# Patient Record
Sex: Male | Born: 2000 | Race: Black or African American | Hispanic: No | Marital: Single | Smoking: Never smoker
Health system: Southern US, Community
[De-identification: ages and names within clinical notes are randomized; demographics above are authoritative.]

## PROBLEM LIST (undated history)

## (undated) DIAGNOSIS — J45909 Unspecified asthma, uncomplicated: Secondary | ICD-10-CM

## (undated) DIAGNOSIS — F988 Other specified behavioral and emotional disorders with onset usually occurring in childhood and adolescence: Secondary | ICD-10-CM

---
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
## 2021-05-03 ENCOUNTER — Encounter (HOSPITAL_COMMUNITY)
Admission: EM | Disposition: A | Discharge status: Home / Self Care | Payer: Self-pay | Source: Home / Self Care | Attending: Emergency Medicine

## 2021-05-03 ENCOUNTER — Emergency Department (HOSPITAL_COMMUNITY): Payer: BC Managed Care – PPO

## 2021-05-03 ENCOUNTER — Encounter (HOSPITAL_COMMUNITY): Payer: Self-pay

## 2021-05-03 ENCOUNTER — Emergency Department (HOSPITAL_COMMUNITY): Payer: BC Managed Care – PPO | Admitting: Registered Nurse

## 2021-05-03 ENCOUNTER — Ambulatory Visit (HOSPITAL_COMMUNITY)
Admission: EM | Admit: 2021-05-03 | Discharge: 2021-05-04 | Disposition: A | Discharge status: Home / Self Care | Payer: BC Managed Care – PPO | Attending: Emergency Medicine | Admitting: Emergency Medicine

## 2021-05-03 ENCOUNTER — Other Ambulatory Visit: Payer: Self-pay

## 2021-05-03 DIAGNOSIS — Z79899 Other long term (current) drug therapy: Secondary | ICD-10-CM | POA: Insufficient documentation

## 2021-05-03 DIAGNOSIS — N4400 Torsion of testis, unspecified: Secondary | ICD-10-CM | POA: Insufficient documentation

## 2021-05-03 DIAGNOSIS — N433 Hydrocele, unspecified: Secondary | ICD-10-CM | POA: Diagnosis not present

## 2021-05-03 HISTORY — PX: TESTICLE TORSION REDUCTION: SHX795

## 2021-05-03 LAB — CBC WITH DIFFERENTIAL/PLATELET
Abs Immature Granulocytes: 0.03 10*3/uL (ref 0.00–0.07)
Basophils Absolute: 0 10*3/uL (ref 0.0–0.1)
Basophils Relative: 0 %
Eosinophils Absolute: 0 10*3/uL (ref 0.0–0.5)
Eosinophils Relative: 0 %
HCT: 45.7 % (ref 39.0–52.0)
Hemoglobin: 15.7 g/dL (ref 13.0–17.0)
Immature Granulocytes: 0 %
Lymphocytes Relative: 7 %
Lymphs Abs: 0.5 10*3/uL — ABNORMAL LOW (ref 0.7–4.0)
MCH: 31.2 pg (ref 26.0–34.0)
MCHC: 34.4 g/dL (ref 30.0–36.0)
MCV: 90.9 fL (ref 80.0–100.0)
Monocytes Absolute: 0.2 10*3/uL (ref 0.1–1.0)
Monocytes Relative: 2 %
Neutro Abs: 7.2 10*3/uL (ref 1.7–7.7)
Neutrophils Relative %: 91 %
Platelets: 251 10*3/uL (ref 150–400)
RBC: 5.03 MIL/uL (ref 4.22–5.81)
RDW: 12.8 % (ref 11.5–15.5)
WBC: 7.9 10*3/uL (ref 4.0–10.5)
nRBC: 0 % (ref 0.0–0.2)

## 2021-05-03 LAB — COMPREHENSIVE METABOLIC PANEL
ALT: 19 U/L (ref 0–44)
AST: 27 U/L (ref 15–41)
Albumin: 4.5 g/dL (ref 3.5–5.0)
Alkaline Phosphatase: 51 U/L (ref 38–126)
Anion gap: 10 (ref 5–15)
BUN: 16 mg/dL (ref 6–20)
CO2: 25 mmol/L (ref 22–32)
Calcium: 9.4 mg/dL (ref 8.9–10.3)
Chloride: 102 mmol/L (ref 98–111)
Creatinine, Ser: 1.03 mg/dL (ref 0.61–1.24)
GFR, Estimated: >60 mL/min (ref >60)
Glucose, Bld: 143 mg/dL — ABNORMAL HIGH (ref 70–99)
Potassium: 4.7 mmol/L (ref 3.5–5.1)
Sodium: 137 mmol/L (ref 135–145)
Total Bilirubin: 0.5 mg/dL (ref 0.3–1.2)
Total Protein: 8 g/dL (ref 6.5–8.1)

## 2021-05-03 LAB — LIPASE, BLOOD: Lipase: 23 U/L (ref 11–51)

## 2021-05-03 SURGERY — TESTICULAR TORSION REPAIR
Anesthesia: General | Site: Scrotum

## 2021-05-03 MED ORDER — BUPIVACAINE HCL 0.25 % IJ SOLN
INTRAMUSCULAR | Status: AC
  Filled 2021-05-03: qty 1

## 2021-05-03 MED ORDER — LACTATED RINGERS IV SOLN
INTRAVENOUS | Status: DC | PRN
  Administered 2021-05-04: 1000 mL via INTRAVENOUS

## 2021-05-03 MED ORDER — ONDANSETRON HCL 4 MG/2ML IJ SOLN
4.0000 mg | Freq: Once | INTRAMUSCULAR | Status: AC
  Administered 2021-05-03: 4 mg via INTRAVENOUS
  Filled 2021-05-03: qty 2

## 2021-05-03 MED ORDER — CEFAZOLIN SODIUM-DEXTROSE 2-4 GM/100ML-% IV SOLN
INTRAVENOUS | Status: AC
  Filled 2021-05-03: qty 100

## 2021-05-03 MED ORDER — SUCCINYLCHOLINE CHLORIDE 200 MG/10ML IV SOSY
PREFILLED_SYRINGE | INTRAVENOUS | Status: DC | PRN
  Administered 2021-05-03: 130 mg via INTRAVENOUS

## 2021-05-03 MED ORDER — DEXAMETHASONE SODIUM PHOSPHATE 10 MG/ML IJ SOLN
INTRAMUSCULAR | Status: AC
  Filled 2021-05-03: qty 1

## 2021-05-03 MED ORDER — FENTANYL CITRATE (PF) 100 MCG/2ML IJ SOLN
INTRAMUSCULAR | Status: DC | PRN
  Administered 2021-05-03: 50 ug via INTRAVENOUS
  Administered 2021-05-03: 100 ug via INTRAVENOUS

## 2021-05-03 MED ORDER — 0.9 % SODIUM CHLORIDE (POUR BTL) OPTIME
TOPICAL | Status: DC | PRN
  Administered 2021-05-03: 1000 mL

## 2021-05-03 MED ORDER — MORPHINE SULFATE (PF) 4 MG/ML IV SOLN
4.0000 mg | Freq: Once | INTRAVENOUS | Status: AC
  Administered 2021-05-03: 4 mg via INTRAVENOUS
  Filled 2021-05-03: qty 1

## 2021-05-03 MED ORDER — MEPERIDINE HCL 50 MG/ML IJ SOLN: 6.2500 mg | INTRAMUSCULAR | Status: DC | PRN

## 2021-05-03 MED ORDER — OXYCODONE HCL 5 MG/5ML PO SOLN: 5.0000 mg | Freq: Once | ORAL | Status: DC | PRN

## 2021-05-03 MED ORDER — FENTANYL CITRATE (PF) 100 MCG/2ML IJ SOLN
25.0000 ug | Freq: Once | INTRAMUSCULAR | Status: AC
  Administered 2021-05-03: 25 ug via INTRAVENOUS
  Filled 2021-05-03: qty 2

## 2021-05-03 MED ORDER — MIDAZOLAM HCL 2 MG/2ML IJ SOLN
INTRAMUSCULAR | Status: AC
  Filled 2021-05-03: qty 2

## 2021-05-03 MED ORDER — SODIUM CHLORIDE 0.9 % IV SOLN
2.0000 g | Freq: Once | INTRAVENOUS | Status: AC
  Administered 2021-05-03: 2 g via INTRAVENOUS

## 2021-05-03 MED ORDER — MIDAZOLAM HCL 2 MG/2ML IJ SOLN: 0.5000 mg | Freq: Once | INTRAMUSCULAR | Status: DC | PRN

## 2021-05-03 MED ORDER — DEXAMETHASONE SODIUM PHOSPHATE 10 MG/ML IJ SOLN
INTRAMUSCULAR | Status: DC | PRN
  Administered 2021-05-03: 10 mg via INTRAVENOUS

## 2021-05-03 MED ORDER — ONDANSETRON HCL 4 MG/2ML IJ SOLN
INTRAMUSCULAR | Status: AC
  Filled 2021-05-03: qty 2

## 2021-05-03 MED ORDER — HYDROMORPHONE HCL 1 MG/ML IJ SOLN: 0.2500 mg | INTRAMUSCULAR | Status: DC | PRN

## 2021-05-03 MED ORDER — MIDAZOLAM HCL 5 MG/5ML IJ SOLN
INTRAMUSCULAR | Status: DC | PRN
  Administered 2021-05-03: 2 mg via INTRAVENOUS

## 2021-05-03 MED ORDER — HYDROMORPHONE HCL 1 MG/ML IJ SOLN
INTRAMUSCULAR | Status: DC | PRN
  Administered 2021-05-03: .5 mg via INTRAVENOUS

## 2021-05-03 MED ORDER — HYDROMORPHONE HCL 2 MG/ML IJ SOLN
INTRAMUSCULAR | Status: AC
  Filled 2021-05-03: qty 1

## 2021-05-03 MED ORDER — OXYCODONE HCL 5 MG PO TABS: 5.0000 mg | ORAL_TABLET | Freq: Once | ORAL | Status: DC | PRN

## 2021-05-03 MED ORDER — PROPOFOL 10 MG/ML IV BOLUS
INTRAVENOUS | Status: DC | PRN
  Administered 2021-05-03: 200 mg via INTRAVENOUS

## 2021-05-03 MED ORDER — PROPOFOL 10 MG/ML IV BOLUS
INTRAVENOUS | Status: AC
  Filled 2021-05-03: qty 20

## 2021-05-03 MED ORDER — SODIUM CHLORIDE 0.9 % IV BOLUS
1000.0000 mL | Freq: Once | INTRAVENOUS | Status: AC
  Administered 2021-05-03: 1000 mL via INTRAVENOUS

## 2021-05-03 MED ORDER — PROMETHAZINE HCL 25 MG/ML IJ SOLN: 6.2500 mg | INTRAMUSCULAR | Status: DC | PRN

## 2021-05-03 MED ORDER — ROCURONIUM BROMIDE 10 MG/ML (PF) SYRINGE
PREFILLED_SYRINGE | INTRAVENOUS | Status: DC | PRN
  Administered 2021-05-03: 45 mg via INTRAVENOUS

## 2021-05-03 MED ORDER — FENTANYL CITRATE (PF) 250 MCG/5ML IJ SOLN
INTRAMUSCULAR | Status: AC
  Filled 2021-05-03: qty 5

## 2021-05-03 MED ORDER — KETOROLAC TROMETHAMINE 30 MG/ML IJ SOLN
30.0000 mg | Freq: Once | INTRAMUSCULAR | Status: AC
  Administered 2021-05-03: 30 mg via INTRAVENOUS
  Filled 2021-05-03: qty 1

## 2021-05-03 SURGICAL SUPPLY — 31 items
BAG COUNTER SPONGE SURGICOUNT (BAG) IMPLANT
BENZOIN TINCTURE PRP APPL 2/3 (GAUZE/BANDAGES/DRESSINGS) ×2 IMPLANT
BNDG GAUZE ELAST 4 BULKY (GAUZE/BANDAGES/DRESSINGS) ×2 IMPLANT
DECANTER SPIKE VIAL GLASS SM (MISCELLANEOUS) ×2 IMPLANT
DERMABOND ADVANCED (GAUZE/BANDAGES/DRESSINGS) ×1
DERMABOND ADVANCED .7 DNX12 (GAUZE/BANDAGES/DRESSINGS) ×1 IMPLANT
DRAIN PENROSE 0.25X18 (DRAIN) IMPLANT
DRAIN PENROSE 0.5X18 (DRAIN) IMPLANT
DRAPE LAPAROTOMY T 98X78 PEDS (DRAPES) ×4 IMPLANT
ELECT PENCIL ROCKER SW 15FT (MISCELLANEOUS) ×2 IMPLANT
ELECT REM PT RETURN 15FT ADLT (MISCELLANEOUS) ×2 IMPLANT
GAUZE SPONGE 4X4 12PLY STRL (GAUZE/BANDAGES/DRESSINGS) ×2 IMPLANT
GLOVE SURG POLYISO LF SZ8 (GLOVE) ×2 IMPLANT
GOWN STRL REUS W/TWL XL LVL3 (GOWN DISPOSABLE) ×2 IMPLANT
HEMOSTAT ARISTA ABSORB 3G PWDR (HEMOSTASIS) ×2 IMPLANT
KIT BASIN OR (CUSTOM PROCEDURE TRAY) ×2 IMPLANT
KIT TURNOVER KIT A (KITS) ×2 IMPLANT
NEEDLE HYPO 22GX1.5 SAFETY (NEEDLE) ×2 IMPLANT
NS IRRIG 1000ML POUR BTL (IV SOLUTION) IMPLANT
PACK GENERAL/GYN (CUSTOM PROCEDURE TRAY) ×2 IMPLANT
SUPPORT SCROTAL LG STRP (MISCELLANEOUS) ×2 IMPLANT
SUT CHROMIC 3 0 SH 27 (SUTURE) ×2 IMPLANT
SUT CHROMIC 4 0 SH 27 (SUTURE) IMPLANT
SUT PROLENE 4 0 PS 2 18 (SUTURE) ×6 IMPLANT
SUT VIC AB 2-0 SH 27 (SUTURE) ×2
SUT VIC AB 2-0 SH 27X BRD (SUTURE) ×1 IMPLANT
SUT VIC AB 3-0 SH 27 (SUTURE) ×2
SUT VIC AB 3-0 SH 27XBRD (SUTURE) ×1 IMPLANT
SUT VICRYL 0 TIES 12 18 (SUTURE) ×2 IMPLANT
SYR CONTROL 10ML LL (SYRINGE) ×2 IMPLANT
WATER STERILE IRR 1000ML POUR (IV SOLUTION) IMPLANT

## 2021-05-03 NOTE — ED Provider Notes (Signed)
[facility]-EMERGENCY DEPT Provider Note   CSN: [id number] Arrival date & time: 05/03/21  1916     History Chief Complaint  Patient presents with   Nephrolithiasis    Philip Beltran is a 20 y.o. male.  HPI  20 year old male with a history of ADD, asthma, who presents to the emergency department today for evaluation of left-sided abdominal pain.  Started having some mild left lower abdominal pain last night, today had some more severe pain that has been constant and worsening since onset.  He also reports associated pain to the left testicle and has had some nausea and vomiting.  He denies any dysuria but does report some urinary retention.  Past Medical History:  Diagnosis Date   ADD (attention deficit disorder)    Asthma     There are no problems to display for this patient.   History reviewed. No pertinent surgical history.     History reviewed. No pertinent family history.     Home Medications Prior to Admission medications   Medication Sig Start Date End Date Taking? Authorizing Provider  acetaminophen (TYLENOL) 160 MG/5ML liquid Take 320 mg by mouth every 4 (four) hours as needed for fever or pain.   Yes [provider]  albuterol (VENTOLIN HFA) 108 (90 Base) MCG/ACT inhaler Inhale 2 puffs into the lungs every 6 (six) hours as needed for wheezing or shortness of breath. 07/25/17  Yes [provider]  ibuprofen (ADVIL) 200 MG tablet Take 200 mg by mouth every 6 (six) hours as needed for mild pain.   Yes [provider]  loratadine (CLARITIN) 10 MG tablet Take 10 mg by mouth daily. 09/02/19  Yes [provider]  ibuprofen (ADVIL,MOTRIN) 400 MG tablet Take 1 tablet (400 mg total) by mouth every 6 (six) hours as needed. Patient not taking: No sig reported 02/02/18   Cato Mulligan, NP    Allergies    Patient has no known allergies.  Review of Systems   Review of Systems  Constitutional:  Negative for  chills and fever.  HENT:  Negative for ear pain and sore throat.   Eyes:  Negative for visual disturbance.  Respiratory:  Negative for cough and shortness of breath.   Cardiovascular:  Negative for chest pain.  Gastrointestinal:  Positive for abdominal pain, nausea and vomiting. Negative for constipation and diarrhea.  Genitourinary:  Positive for difficulty urinating. Negative for dysuria and hematuria.  Musculoskeletal:  Negative for back pain.  Skin:  Negative for rash.  Neurological:  Negative for headaches.  All other systems reviewed and are negative.  Physical Exam Updated Vital Signs BP 132/84   Pulse 92   Temp 98 F (36.7 C) (Oral)   Resp 17   Ht 5\' 4"  (1.626 m)   Wt 54.4 kg   SpO2 100%   BMI 20.60 kg/m   Physical Exam Vitals and nursing note reviewed.  Constitutional:      General: He is in acute distress.     Appearance: He is well-developed.  HENT:     Head: Normocephalic and atraumatic.  Eyes:     Conjunctiva/sclera: Conjunctivae normal.  Cardiovascular:     Rate and Rhythm: Normal rate and regular rhythm.     Heart sounds: Normal heart sounds. No murmur heard. Pulmonary:     Effort: Pulmonary effort is normal. No respiratory distress.     Breath sounds: Normal breath sounds. No wheezing, rhonchi or rales.  Abdominal:     General:  Bowel sounds are normal.     Palpations: Abdomen is soft.     Tenderness: There is abdominal tenderness (left inguinal region). There is no right CVA tenderness, left CVA tenderness, guarding or rebound.  Genitourinary:    Comments: Chaperone present. Normal uncircumcised penis. TTP noted to the left testicle. No right testicular TTP present. No erythema, swelling or warmth noted. No lesions or rashes noted. Musculoskeletal:     Cervical back: Neck supple.  Skin:    General: Skin is warm and dry.  Neurological:     Mental Status: He is alert.    ED Results / Procedures / Treatments   Labs (all labs ordered are listed, but  only abnormal results are displayed) Labs Reviewed  CBC WITH DIFFERENTIAL/PLATELET - Abnormal; Notable for the following components:      Result Value   Lymphs Abs 0.5 (*)    All other components within normal limits  COMPREHENSIVE METABOLIC PANEL - Abnormal; Notable for the following components:   Glucose, Bld 143 (*)    All other components within normal limits  LIPASE, BLOOD  URINALYSIS, ROUTINE W REFLEX MICROSCOPIC    EKG None  Radiology CT Renal Stone Study  Result Date: 05/03/2021 CLINICAL DATA:  Flank pain, nausea and vomiting. EXAM: CT ABDOMEN AND PELVIS WITHOUT CONTRAST TECHNIQUE: Multidetector CT imaging of the abdomen and pelvis was performed following the standard protocol without IV contrast. COMPARISON:  None. FINDINGS: Lower chest: Unremarkable Hepatobiliary: Unremarkable Pancreas: Unremarkable Spleen: Unremarkable Adrenals/Urinary Tract: Unremarkable Stomach/Bowel: Small appendicolith noted without findings of appendiceal inflammation. Vascular/Lymphatic: Unremarkable Reproductive: Left hydrocele. Other: No supplemental non-categorized findings. Musculoskeletal: Unremarkable IMPRESSION: 1. Left scrotal hydrocele. Please see critical results in the report for left scrotal ultrasound. Electronically Signed   By: Gaylyn Rong M.D.   On: 05/03/2021 22:13   US SCROTUM W/DOPPLER  Addendum Date: 05/03/2021   ADDENDUM REPORT: 05/03/2021 22:14 ADDENDUM: Critical Value/emergent results were called by telephone at the time of interpretation on 05/03/2021 at 10:11 pm to provider Dr. Bruce Donath, who verbally acknowledged these results. Electronically Signed   By: Gaylyn Rong M.D.   On: 05/03/2021 22:14   Result Date: 05/03/2021 CLINICAL DATA:  Left testicular pain EXAM: SCROTAL ULTRASOUND DOPPLER ULTRASOUND OF THE TESTICLES TECHNIQUE: Complete ultrasound examination of the testicles, epididymis, and other scrotal structures was performed. Color and spectral Doppler ultrasound  were also utilized to evaluate blood flow to the testicles. COMPARISON:  None. FINDINGS: Right testicle Measurements: 4.4 by 2.5 by 2.9 cm. No mass or microlithiasis visualized. Left testicle Measurements: 4.4 by 2.8 by 2.8 cm. No mass or microlithiasis visualized. Right epididymis:  Normal in size and appearance. Left epididymis:  Poorly seen Hydrocele:  Left Varicocele:  None visualized. Pulsed Doppler interrogation of both testes demonstrates lack of normal arterial and venous waveforms in the left testicle favoring testicular ischemia/infarct. IMPRESSION: 1. Lack of arterial and venous waveforms in the left testicle along with left testicular hydrocele and obscuration of the left epididymis. Appearance compatible with testicular ischemia/infarct. Emergent urology consultation is indicated. Radiology assistant personnel have been notified to put me in telephone contact with the referring physician or the referring physician's clinical representative in order to discuss these findings. Once this communication is established I will issue an addendum to this report for documentation purposes. Electronically Signed: By: Gaylyn Rong M.D. On: 05/03/2021 22:07    Procedures Procedures    Medications Ordered in ED Medications  ceFAZolin (ANCEF) 2 g in sodium chloride 0.9 % 100 mL  IVPB (has no administration in time range)  ceFAZolin (ANCEF) 2-4 GM/100ML-% IVPB (has no administration in time range)  ketorolac (TORADOL) 30 MG/ML injection 30 mg (30 mg Intravenous Given 05/03/21 2111)  ondansetron (ZOFRAN) injection 4 mg (4 mg Intravenous Given 05/03/21 2111)  sodium chloride 0.9 % bolus 1,000 mL (0 mLs Intravenous Stopped 05/03/21 2232)  fentaNYL (SUBLIMAZE) injection 25 mcg (25 mcg Intravenous Given 05/03/21 2133)  morphine 4 MG/ML injection 4 mg (4 mg Intravenous Given 05/03/21 2229)  ondansetron (ZOFRAN) injection 4 mg (4 mg Intravenous Given 05/03/21 2229)    ED Course  I have reviewed the triage  vital signs and the nursing notes.  Pertinent labs & imaging results that were available during my care of the patient were reviewed by me and considered in my medical decision making (see chart for details).    MDM Rules/Calculators/A&P                          20 y/o M presenting for abd pain and testicular pain  Reviewed/interpreted labs CBC wnl CMP unremarkable  Lipase neg UA pending on admission  Reviewed/interpreted imaging CT renal - 1. Left scrotal hydrocele. Please see critical results in the report for left scrotal ultrasound. Scrotal US - 1. Lack of arterial and venous waveforms in the left testicle along with left testicular hydrocele and obscuration of the left epididymis. Appearance compatible with testicular ischemia/infarct. Emergent urology consultation is indicated.  10:15 PM CONSULT with Dr. Roselee Nova pace with urology who will admit the patient.   Final Clinical Impression(s) / ED Diagnoses Final diagnoses:  Testicular torsion    Rx / DC Orders ED Discharge Orders     None        Rayne Du 05/03/21 2316    Lorre Nick, MD 05/04/21 1009

## 2021-05-03 NOTE — ED Notes (Signed)
US tech at bedside

## 2021-05-03 NOTE — ED Notes (Signed)
Pt taken to surgery

## 2021-05-03 NOTE — Anesthesia Preprocedure Evaluation (Addendum)
Anesthesia Evaluation  Patient identified by MRN, date of birth, ID band Patient awake    Reviewed: Allergy & Precautions, NPO status , Patient's Chart, lab work & pertinent test results  History of Anesthesia Complications Negative for: history of anesthetic complications  Airway Mallampati: II  TM Distance: >3 FB Neck ROM: Full    Dental  (+) Dental Advisory Given   Pulmonary asthma ,    breath sounds clear to auscultation       Cardiovascular negative cardio ROS   Rhythm:Regular Rate:Normal     Neuro/Psych PSYCHIATRIC DISORDERS (ADD) negative neurological ROS     GI/Hepatic Neg liver ROS, N/v with this testicular torsion   Endo/Other  negative endocrine ROS  Renal/GU negative Renal ROS   torsion    Musculoskeletal   Abdominal   Peds  Hematology negative hematology ROS (+)   Anesthesia Other Findings   Reproductive/Obstetrics                            Anesthesia Physical Anesthesia Plan  ASA: 2 and emergent  Anesthesia Plan: General   Post-op Pain Management:    Induction: Intravenous and Rapid sequence  PONV Risk Score and Plan: 2 and Ondansetron and Dexamethasone  Airway Management Planned: Oral ETT  Additional Equipment: None  Intra-op Plan:   Post-operative Plan: Extubation in OR  Informed Consent: I have reviewed the patients History and Physical, chart, labs and discussed the procedure including the risks, benefits and alternatives for the proposed anesthesia with the patient or authorized representative who has indicated his/her understanding and acceptance.     Dental advisory given and Consent reviewed with POA  Plan Discussed with: CRNA and Surgeon  Anesthesia Plan Comments: (Discussed with patient and his mother, both understand and wish to proceed)       Anesthesia Quick Evaluation

## 2021-05-03 NOTE — ED Triage Notes (Signed)
Pt just left urgent care, mom says that they think he has a kidney stone. Pt has not urinated since yesterday. Pt is having nausea and vomiting today. Pt complains of pain to his lower abdomen.

## 2021-05-03 NOTE — Consult Note (Signed)
I have been asked to see the patient by Dr. Lorre Nick for evaluation and management of testicular torsion.  History of present illness: 20 year old man presented to the ED with worsening left testicular pain, nausea and vomiting that began this morning.  Scrotal ultrasound showed no arterial blood flow consistent with testicular torsion.  This has never happened before.    Review of systems: A 12 point comprehensive review of systems was obtained and is negative unless otherwise stated in the history of present illness.  There are no problems to display for this patient.   No current facility-administered medications on file prior to encounter.   Current Outpatient Medications on File Prior to Encounter  Medication Sig Dispense Refill   acetaminophen (TYLENOL) 160 MG/5ML liquid Take 320 mg by mouth every 4 (four) hours as needed for fever or pain.     albuterol (VENTOLIN HFA) 108 (90 Base) MCG/ACT inhaler Inhale 2 puffs into the lungs every 6 (six) hours as needed for wheezing or shortness of breath.     ibuprofen (ADVIL) 200 MG tablet Take 200 mg by mouth every 6 (six) hours as needed for mild pain.     loratadine (CLARITIN) 10 MG tablet Take 10 mg by mouth daily.     ibuprofen (ADVIL,MOTRIN) 400 MG tablet Take 1 tablet (400 mg total) by mouth every 6 (six) hours as needed. (Patient not taking: No sig reported) 30 tablet 0    Past Medical History:  Diagnosis Date   ADD (attention deficit disorder)    Asthma     History reviewed. No pertinent surgical history.     History reviewed. No pertinent family history.  PE: Vitals:   05/03/21 2100 05/03/21 2130 05/03/21 2203 05/03/21 2230  BP: 125/86 130/70 115/72 132/84  Pulse: 65 67 72 92  Resp:  18 17 17   Temp:      TempSrc:      SpO2: 100% 100% 100% 100%  Weight:      Height:       Patient appears to be in no acute distress  patient is alert and oriented x3 Atraumatic normocephalic head No cervical or supraclavicular  lymphadenopathy appreciated No increased work of breathing, no audible wheezes/rhonchi Regular sinus rhythm/rate Abdomen is soft, nontender, nondistended, no CVA or suprapubic tenderness GU: circ phallus, descended testicles, left testicle firm and enlarged, right testicle normal Lower extremities are symmetric without appreciable edema Grossly neurologically intact No identifiable skin lesions  Recent Labs    05/03/21 2030  WBC 7.9  HGB 15.7  HCT 45.7   Recent Labs    05/03/21 2030  NA 137  K 4.7  CL 102  CO2 25  GLUCOSE 143*  BUN 16  CREATININE 1.03  CALCIUM 9.4   No results for input(s): LABPT, INR in the last 72 hours. No results for input(s): LABURIN in the last 72 hours. No results found for this or any previous visit.  Imaging: Scrotal 07/04/21  IMPRESSION: 1. Lack of arterial and venous waveforms in the left testicle along with left testicular hydrocele and obscuration of the left epididymis. Appearance compatible with testicular ischemia/infarct. Emergent urology consultation is indicated.   Radiology assistant personnel have been notified to put me in telephone contact with the referring physician or the referring physician's clinical representative in order to discuss these findings. Once this communication is established I will issue an addendum to this report for documentation purposes.   Electronically Signed: By: [place] M.D. On: 05/03/2021 22:07  Imp/Recommendations:  LEFT testicular torsion: Risks and benefits of scrotal exploration, possible left orchiopexy versus orchiectomy, right orchiopexy discussed with the patient in detail.  These include but are not limited to pain, bleeding, damage to surrounding structures, hematoma, infection, need for future procedure, recurrence.  Patient has elected to proceed with emergent surgery.  His mother was present for the entire discussion.    Itzae Mccurdy D Annelle Behrendt

## 2021-05-03 NOTE — Anesthesia Procedure Notes (Signed)
Procedure Name: Intubation Date/Time: 05/03/2021 11:16 PM Performed by: Lissa Morales, CRNA Pre-anesthesia Checklist: Patient identified, Emergency Drugs available, Suction available and Patient being monitored Patient Re-evaluated:Patient Re-evaluated prior to induction Oxygen Delivery Method: Circle system utilized Preoxygenation: Pre-oxygenation with 100% oxygen Induction Type: IV induction, Rapid sequence and Cricoid Pressure applied Laryngoscope Size: Mac and 4 Grade View: Grade I Tube type: Oral Tube size: 7.5 mm Number of attempts: 1 Airway Equipment and Method: Stylet and Oral airway Placement Confirmation: ETT inserted through vocal cords under direct vision, positive ETCO2 and breath sounds checked- equal and bilateral Secured at: 22 cm Tube secured with: Tape Dental Injury: Teeth and Oropharynx as per pre-operative assessment

## 2021-05-04 ENCOUNTER — Encounter (HOSPITAL_COMMUNITY): Payer: Self-pay | Admitting: Urology

## 2021-05-04 DIAGNOSIS — N4400 Torsion of testis, unspecified: Secondary | ICD-10-CM | POA: Diagnosis not present

## 2021-05-04 MED ORDER — HYDROCODONE-ACETAMINOPHEN 5-325 MG PO TABS: 1.0000 | ORAL_TABLET | ORAL | 0 refills | Status: AC | PRN

## 2021-05-04 MED ORDER — BUPIVACAINE HCL 0.25 % IJ SOLN
INTRAMUSCULAR | Status: DC | PRN
  Administered 2021-05-04: 10 mL

## 2021-05-04 MED ORDER — ONDANSETRON HCL 4 MG/2ML IJ SOLN
INTRAMUSCULAR | Status: DC | PRN
  Administered 2021-05-04: 4 mg via INTRAVENOUS

## 2021-05-04 MED ORDER — SUGAMMADEX SODIUM 200 MG/2ML IV SOLN
INTRAVENOUS | Status: DC | PRN
  Administered 2021-05-04: 125 mg via INTRAVENOUS

## 2021-05-04 NOTE — Transfer of Care (Signed)
Immediate Anesthesia Transfer of Care Note  Patient: Philip Beltran  Procedure(s) Performed: SCROTAL EXPLORATION, BILATERAL ORCHIOPEXY (Scrotum)  Patient Location: PACU  Anesthesia Type:General  Level of Consciousness: awake and patient cooperative , drowsy Airway & Oxygen Therapy: Patient Spontanous Breathing and Patient connected to face mask oxygen  Post-op Assessment: Report given to RN, Post -op Vital signs reviewed and stable and Patient moving all extremities X 4  Post vital signs: stable  Last Vitals:  Vitals Value Taken Time  BP 108/58 05/04/21 0030  Temp 36.6 C 05/04/21 0024  Pulse 97 05/04/21 0030  Resp 18 05/04/21 0030  SpO2 93 % 05/04/21 0030  Vitals shown include unvalidated device data.  Last Pain:  Vitals:   05/03/21 2258  TempSrc:   PainSc: 5          Complications: No notable events documented.

## 2021-05-04 NOTE — Anesthesia Postprocedure Evaluation (Signed)
Anesthesia Post Note  Patient: Philip Beltran  Procedure(s) Performed: SCROTAL EXPLORATION, BILATERAL ORCHIOPEXY (Scrotum)     Patient location during evaluation: PACU Anesthesia Type: General Level of consciousness: awake and alert, patient cooperative and oriented Pain management: pain level controlled (pt states he is having no pain) Vital Signs Assessment: post-procedure vital signs reviewed and stable Respiratory status: spontaneous breathing, nonlabored ventilation and respiratory function stable Cardiovascular status: blood pressure returned to baseline and stable Postop Assessment: no apparent nausea or vomiting Anesthetic complications: no   No notable events documented.  Last Vitals:  Vitals:   05/04/21 0030 05/04/21 0045  BP: (!) 108/58 119/70  Pulse: 82 (!) 115  Resp: 19 (!) 23  Temp:    SpO2: 93% 93%    Last Pain:  Vitals:   05/03/21 2258  TempSrc:   PainSc: 5                  Kynzlee Hucker,E. Perrin Eddleman

## 2021-05-04 NOTE — Discharge Instructions (Signed)
Scrotal surgery postoperative instructions  Wound:  In most cases your incision will have absorbable sutures that will dissolve within the first 10-20 days. Some will fall out even earlier. Expect some redness as the sutures dissolved but this should occur only around the sutures. If there is generalized redness, especially with increasing pain or swelling, let us know. The scrotum will very likely get "black and blue" as the blood in the tissues spread. Sometimes the whole scrotum will turn colors. The black and blue is followed by a yellow and brown color. In time, all the discoloration will go away. In some cases some firm swelling in the area of the testicle may persist for up to 4-6 weeks after the surgery and is considered normal in most cases.  Diet:  You may return to your normal diet within 24 hours following your surgery. You may note some mild nausea and possibly vomiting the first 6-8 hours following surgery. This is usually due to the side effects of anesthesia, and will disappear quite soon. I would suggest clear liquids and a very light meal the first evening following your surgery.  Activity:  Your physical activity should be restricted the first 48 hours. During that time you should remain relatively inactive, moving about only when necessary. During the first 7-10 days following surgery he should avoid lifting any heavy objects (anything greater than 15 pounds), and avoid strenuous exercise. If you work, ask us specifically about your restrictions, both for work and home. We will write a note to your employer if needed.  You should plan to wear a tight pair of jockey shorts or an athletic supporter for the first 4-5 days, even to sleep. This will keep the scrotum immobilized to some degree and keep the swelling down.  Ice packs should be placed on and off over the scrotum for the first 48 hours. Frozen peas or corn in a ZipLock bag can be frozen, used and re-frozen. Fifteen minutes  on and 15 minutes off is a reasonable schedule. The ice is a good pain reliever and keeps the swelling down.  Hygiene:  You may shower 48 hours after your surgery. Tub bathing should be restricted until the seventh day.          Medication:  You will be sent home with some type of pain medication. In many cases you will be sent home with a narcotic pain pill (hydrococone or oxycodone). If the pain is not too bad, you may take either Tylenol (acetaminophen) or Advil (ibuprofen) which contain no narcotic agents, and might be tolerated a little better, with fewer side effects. If the pain medication you are sent home with does not control the pain, you will have to let us know. Some narcotic pain medications cannot be given or refilled by a phone call to a pharmacy.  Problems you should report to us:   Fever of 101.0 degrees Fahrenheit or greater.  Moderate or severe swelling under the skin incision or involving the scrotum.  Drug reaction such as hives, a rash, nausea or vomiting.  

## 2021-05-04 NOTE — Op Note (Signed)
Operative Note  Preoperative diagnosis:  1.  Left testicular torsion  Postoperative diagnosis: 1.  Left testicular torsion  Procedure(s): 1.  Scrotal exploration 2.   Detorsion of left testicular torsion 3.   Bilateral orchiopexy  Surgeon: Kasandra Knudsen, MD  Assistants:  None  Anesthesia:  General  Complications:  None  EBL: 25 mL  Specimens: 1.  None  Drains/Catheters: 1.  None  Intraoperative findings:   Left testicle with 2.5 twist of spermatic cord -testicle appeared dark purple however after wrapped in warm gauze return to light purple/pink.  Active bleeding when tunica albuginea was incised. Normal right testicle Bilateral three-point fixation with 4-0 Prolene  Indication:  Philip Beltran is a 20 y.o. male with who presented to the ED with acute onset left flank pain associate with nausea and vomiting found to have testicular torsion on scrotal ultrasound.  Description of procedure:  After risks and benefits of the procedure were discussed with the patient and his mother informed consent was obtained.  The patient was then taken to the operating room in emergent fashion.   Anesthesia was in induced and antibiotics were administered.  The patient was prepped and draped in usual sterile fashion.  A timeout was performed.  A vertical incision was made over the median raphae.  It was carried down over the left hemiscrotum which was the affected side.  Dissection continued until the testicle was delivered into the field.  The tunica vaginalis was sharply opened and the testicle examined.  There is noted to be 2.5 twists of the spermatic cord.  The testicle appeared edematous and dark purple.  It was detorsed and wrapped in a warm gauze.  Attention then turned to the right side.  The right side was delivered in a similar manner.  The right side was normal and the appendix testis was cauterized.  A three-point fixation was then performed with 4-0 Prolene medial lateral and  inferior.  Hemostasis was deemed adequate and Arista was used.  The testicle was secured with the lateral sulcus lateral in proper anatomic position.  Attention then returned back to the left testicle which appeared to to lighten in color and be more consistent with viability.  A knife was sharply used to open the tunica albuginea and active bleeding was seen.  The opening in the albuginea was then closed with a single 4-0 Vicryl suture.  A three-point fixation was then performed in the exact same manner as the right side.  Arista was also used for hemostasis.  Care was taken to ensure the lateral sulcus was lateral and the testicle fixed in proper anatomic position.  The dartos was then closed with a running 2-0 Vicryl suture.  The skin was closed with interrupted 3-0 chromic.  Quarter percent Marcaine was used for local anesthesia.  Dermabond was used.  Scrotal fluffs were applied.  The patient was awakened from anesthesia and transferred the PACU in stable condition.  Follow-up: The patient will be discharged home tonight with pain medication.  He will follow-up in the office approximately 2 weeks.

## 2021-08-15 ENCOUNTER — Emergency Department (HOSPITAL_COMMUNITY): Payer: Medicaid Other

## 2021-08-15 ENCOUNTER — Emergency Department (HOSPITAL_COMMUNITY)
Admission: EM | Admit: 2021-08-15 | Discharge: 2021-08-15 | Disposition: A | Discharge status: Home / Self Care | Payer: Medicaid Other | Attending: Emergency Medicine | Admitting: Emergency Medicine

## 2021-08-15 ENCOUNTER — Encounter (HOSPITAL_COMMUNITY): Payer: Self-pay | Admitting: Emergency Medicine

## 2021-08-15 DIAGNOSIS — N50811 Right testicular pain: Secondary | ICD-10-CM | POA: Diagnosis present

## 2021-08-15 DIAGNOSIS — J45909 Unspecified asthma, uncomplicated: Secondary | ICD-10-CM | POA: Diagnosis not present

## 2021-08-15 LAB — COMPREHENSIVE METABOLIC PANEL
ALT: 12 U/L (ref 0–44)
AST: 18 U/L (ref 15–41)
Albumin: 4.3 g/dL (ref 3.5–5.0)
Alkaline Phosphatase: 61 U/L (ref 38–126)
Anion gap: 8 (ref 5–15)
BUN: 14 mg/dL (ref 6–20)
CO2: 26 mmol/L (ref 22–32)
Calcium: 9.1 mg/dL (ref 8.9–10.3)
Chloride: 103 mmol/L (ref 98–111)
Creatinine, Ser: 1.27 mg/dL — ABNORMAL HIGH (ref 0.61–1.24)
GFR, Estimated: >60 mL/min (ref >60)
Glucose, Bld: 99 mg/dL (ref 70–99)
Potassium: 4.2 mmol/L (ref 3.5–5.1)
Sodium: 137 mmol/L (ref 135–145)
Total Bilirubin: 0.4 mg/dL (ref 0.3–1.2)
Total Protein: 7.5 g/dL (ref 6.5–8.1)

## 2021-08-15 LAB — URINALYSIS, ROUTINE W REFLEX MICROSCOPIC
Bilirubin Urine: NEGATIVE
Glucose, UA: NEGATIVE mg/dL
Hgb urine dipstick: NEGATIVE
Ketones, ur: NEGATIVE mg/dL
Leukocytes,Ua: NEGATIVE
Nitrite: NEGATIVE
Protein, ur: NEGATIVE mg/dL
Specific Gravity, Urine: 1.015 (ref 1.005–1.030)
pH: 7 (ref 5.0–8.0)

## 2021-08-15 LAB — CBC WITH DIFFERENTIAL/PLATELET
Abs Immature Granulocytes: 0.01 10*3/uL (ref 0.00–0.07)
Basophils Absolute: 0.1 10*3/uL (ref 0.0–0.1)
Basophils Relative: 1 %
Eosinophils Absolute: 0.2 10*3/uL (ref 0.0–0.5)
Eosinophils Relative: 5 %
HCT: 43.9 % (ref 39.0–52.0)
Hemoglobin: 14.7 g/dL (ref 13.0–17.0)
Immature Granulocytes: 0 %
Lymphocytes Relative: 45 %
Lymphs Abs: 2.2 10*3/uL (ref 0.7–4.0)
MCH: 30.1 pg (ref 26.0–34.0)
MCHC: 33.5 g/dL (ref 30.0–36.0)
MCV: 89.8 fL (ref 80.0–100.0)
Monocytes Absolute: 0.5 10*3/uL (ref 0.1–1.0)
Monocytes Relative: 11 %
Neutro Abs: 1.9 10*3/uL (ref 1.7–7.7)
Neutrophils Relative %: 38 %
Platelets: 236 10*3/uL (ref 150–400)
RBC: 4.89 MIL/uL (ref 4.22–5.81)
RDW: 12.3 % (ref 11.5–15.5)
WBC: 4.9 10*3/uL (ref 4.0–10.5)
nRBC: 0 % (ref 0.0–0.2)

## 2021-08-15 NOTE — ED Provider Notes (Signed)
[facility]-EMERGENCY DEPT Provider Note   CSN: [id number] Arrival date & time: 08/15/21  1307     History Chief Complaint  Patient presents with   Testicle Pain    Philip Beltran is a 20 y.o. male. With past medical history of asthma and previous left testicular torsion in July 2022 s/p bilateral orchiopexy who presents to the emergency department with right testicular pain.  States that he had constant lower right sided pelvic pain that came on suddenly last night. With no associated symptoms. States that he went to bed and woke up this morning with right testicular pain similar to pain when he had testicular torsion. States testicular pain has been severe but at current is 2/10. Denies swelling to the penis or testicles, redness, warmth or trauma to his penis or testicles. He denies diaphoresis, nausea, vomiting, diarrhea. Denies urinary symptoms, penile discharge or concern for STDs or history or STDs, fever.     Testicle Pain Associated symptoms include abdominal pain.      Past Medical History:  Diagnosis Date   ADD (attention deficit disorder)    Asthma     There are no problems to display for this patient.   Past Surgical History:  Procedure Laterality Date   TESTICLE TORSION REDUCTION N/A 05/03/2021   Procedure: SCROTAL EXPLORATION, BILATERAL ORCHIOPEXY;  Surgeon: Noel Christmas, MD;  Location: [facility] ORS;  Service: Urology;  Laterality: N/A;       No family history on file.  Social History   Tobacco Use   Smoking status: Never   Smokeless tobacco: Never    Home Medications Prior to Admission medications   Medication Sig Start Date End Date Taking? Authorizing Provider  acetaminophen (TYLENOL) 160 MG/5ML liquid Take 320 mg by mouth every 4 (four) hours as needed for fever or pain.    [provider]  albuterol (VENTOLIN HFA) 108 (90 Base) MCG/ACT inhaler Inhale 2 puffs into the lungs every 6 (six) hours as needed for wheezing  or shortness of breath. 07/25/17   [provider]  HYDROcodone-acetaminophen (NORCO/VICODIN) 5-325 MG tablet Take 1 tablet by mouth every 4 (four) hours as needed for moderate pain. 05/04/21 05/04/22  Noel Christmas, MD  ibuprofen (ADVIL) 200 MG tablet Take 200 mg by mouth every 6 (six) hours as needed for mild pain.    [provider]  loratadine (CLARITIN) 10 MG tablet Take 10 mg by mouth daily. 09/02/19   [provider]    Allergies    Patient has no known allergies.  Review of Systems   Review of Systems  Constitutional:  Negative for fever.  Gastrointestinal:  Positive for abdominal pain. Negative for nausea and vomiting.  Genitourinary:  Positive for testicular pain. Negative for flank pain, penile discharge, penile pain, penile swelling and scrotal swelling.  All other systems reviewed and are negative.  Physical Exam Updated Vital Signs BP 125/72 (BP Location: Left Arm)   Pulse 71   Temp 98.7 F (37.1 C) (Oral)   Resp 16   SpO2 100%   Physical Exam Vitals and nursing note reviewed. Exam conducted with a chaperone present.  Constitutional:      General: He is not in acute distress.    Appearance: Normal appearance. He is not toxic-appearing.  HENT:     Head: Normocephalic and atraumatic.  Eyes:     General: No scleral icterus. Cardiovascular:     Rate and Rhythm: Normal rate and regular rhythm.  Pulses: Normal pulses.  Pulmonary:     Effort: Pulmonary effort is normal.     Breath sounds: Normal breath sounds.  Abdominal:     General: Bowel sounds are normal. There is no distension.     Palpations: Abdomen is soft.     Tenderness: There is no abdominal tenderness. There is no right CVA tenderness, left CVA tenderness, guarding or rebound.     Hernia: No hernia is present.  Genitourinary:    Penis: Normal.      Testes: Normal.     Comments: Exam performed with chaperone present  Exam reveals circumcised penis without erythema or  swelling. No priapism. No discharge at the meatus. No phimosis or paraphimosis present.  Testicles are descended bilaterally and with vertical lie No evidence of epididymitis, orchitis, hydrocele.  Negative Prehn's sign.  No tenderness to palpation of penis or scrotum.  Musculoskeletal:        General: Normal range of motion.  Skin:    General: Skin is warm and dry.     Capillary Refill: Capillary refill takes less than 2 seconds.  Neurological:     General: No focal deficit present.     Mental Status: He is alert and oriented to person, place, and time.  Psychiatric:        Mood and Affect: Mood normal.        Behavior: Behavior normal.    ED Results / Procedures / Treatments   Labs (all labs ordered are listed, but only abnormal results are displayed) Labs Reviewed - No data to display  EKG None  Radiology US SCROTUM W/DOPPLER  Result Date: 08/15/2021 CLINICAL DATA:  History of testicular torsion, presenting with testicular pain for 1 day. EXAM: SCROTAL ULTRASOUND DOPPLER ULTRASOUND OF THE TESTICLES TECHNIQUE: Complete ultrasound examination of the testicles, epididymis, and other scrotal structures was performed. Color and spectral Doppler ultrasound were also utilized to evaluate blood flow to the testicles. COMPARISON:  Scrotal ultrasound dated 05/03/2021. FINDINGS: Right testicle Measurements: 4.0 x 2.2 x 3.5 cm. No mass or microlithiasis visualized. Left testicle Measurements: 5.1 x 2.5 x 2.4 cm. No mass or microlithiasis visualized. Right epididymis:  Normal in size and appearance. Left epididymis:  Normal in size and appearance. Hydrocele:  None visualized. Varicocele:  None visualized. Pulsed Doppler interrogation of both testes demonstrates normal low resistance arterial and venous waveforms bilaterally. IMPRESSION: Normal exam. Electronically Signed   By: Romona Curls M.D.   On: 08/15/2021 15:23    Procedures Procedures   Medications Ordered in ED Medications - No data  to display  ED Course  I have reviewed the triage vital signs and the nursing notes.  Pertinent labs & imaging results that were available during my care of the patient were reviewed by me and considered in my medical decision making (see chart for details).    MDM Rules/Calculators/A&P 20 year old man who presents with HPI, PE and workup as described above.  Presentation is most concerning for torsion, although patient has recent history of bilateral orchiopexy. Given that symptoms are similar to previous torsion, obtaining US r/o.   Ultrasound reveals no torsion bilaterally. Also notes no hydrocele, varicocele, epididymitis. Presentation is not consistent with scrotal abscess, severe cellulitis, Fournier's gangrene, orchitis, epididymitis, inguinal hernia or other emergent cause.  UA negative for UTI.  WBC without leukocytosis, patient without fever, tachycardia, or systemic symptoms concerning for intraabdominal infection. Abdominal imaging not indicated at this time.   CMP with Creatinine of 1.27 which is increased from  1.03 in July. I have no previous creatinine values to compare. I have requested that the patient present to [facility] or PCP to have creatinine checked in 1 week after he ensures adequate PO intake. Advised to avoid NSAIDs until trending of creatinine has occurred. He verbalizes understanding with teach back.  At this time vitals are stable and patient is stable for discharge. He understands to return should he have increasing abdominal pain, fever, urinary symptoms or penile/testicular symptoms worsening.    Final Clinical Impression(s) / ED Diagnoses Final diagnoses:  Pain in right testicle    Rx / DC Orders ED Discharge Orders     None        Cristopher Peru, PA-C 08/15/21 2043    Cathren Laine, MD 08/17/21 [phone]

## 2021-08-15 NOTE — Discharge Instructions (Signed)
You were seen in the emergency department today for right testicle pain. The ultrasound revealed that you were NOT having testicular torsion. Your labs were all reassuring, however your creatinine, which is a reflection of your kidney function, is elevated. I would like for you to return to the [facility] in 1 week for a recheck of your kidney function. In the mean time, please continue to drink lots of fluids and avoid NSAIDs such as ibuprofen, Naproxen, Aleve, Advil, Motrin. For pain, continue to take tylenol as needed. Please come back to the emergency department if you begin to experience worsening of your lower abdominal pain or testicular pain.

## 2021-08-15 NOTE — ED Triage Notes (Signed)
Patient here from home with mom reporting right side testicle pain. Hx of left sided torsion in July.

## 2022-05-23 IMAGING — CT CT RENAL STONE PROTOCOL
2 of 4 series · 16 of 46 positions shown, 18 images · non-contrast
Comparison: None.

CLINICAL DATA: Flank pain, nausea and vomiting.

EXAM:
CT ABDOMEN AND PELVIS WITHOUT CONTRAST
TECHNIQUE: Multidetector CT imaging of the abdomen and pelvis was performed
following the standard protocol without IV contrast.

[Series 2: axial st · axial · 0.64mm/px · z∈[-506,-106]mm · 13 of 92 slices shown, 15 images]
[im 6/92  soft-tissue]
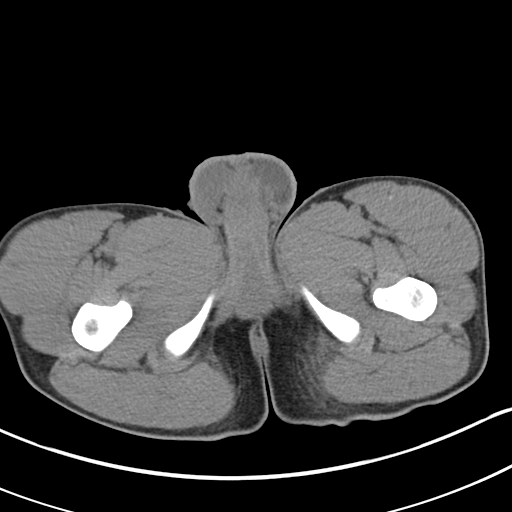
[im 6/92  bone]
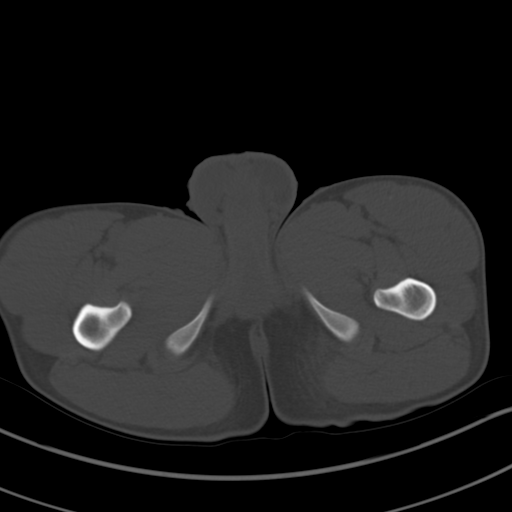
[im 11/92  soft-tissue]
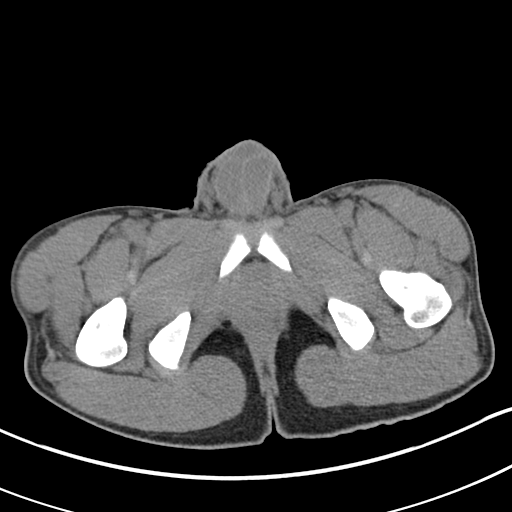
[im 21/92  soft-tissue]
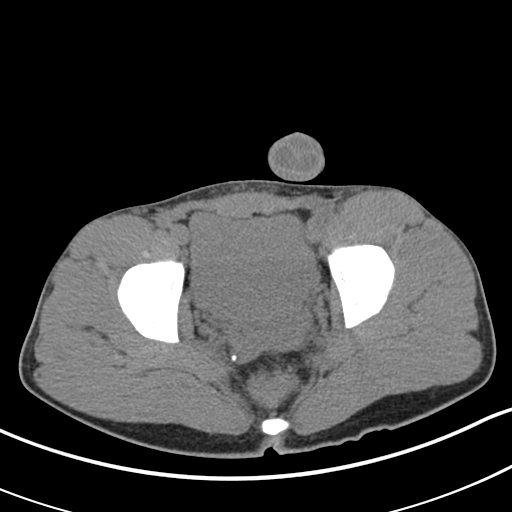
[im 26/92  soft-tissue]
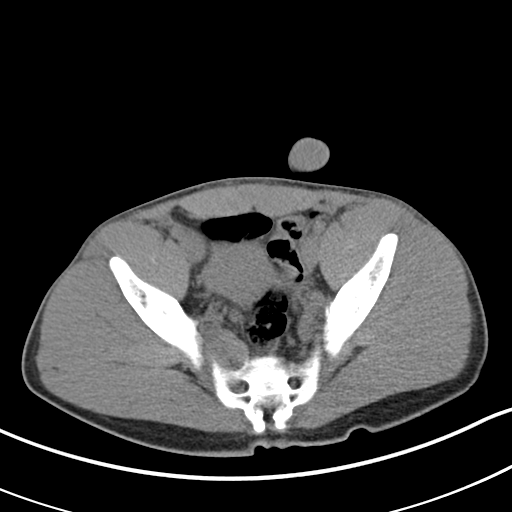
[im 31/92  soft-tissue]
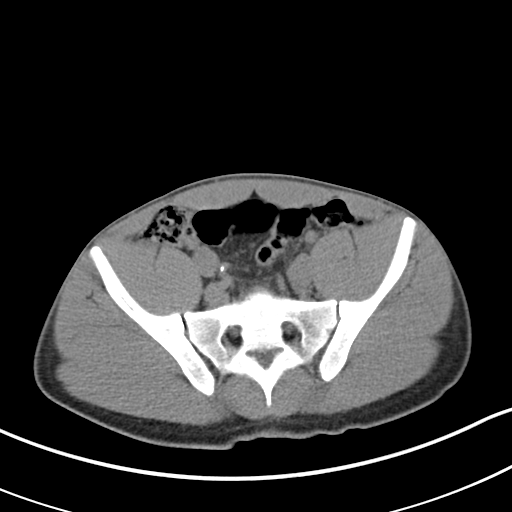
[im 41/92  soft-tissue]
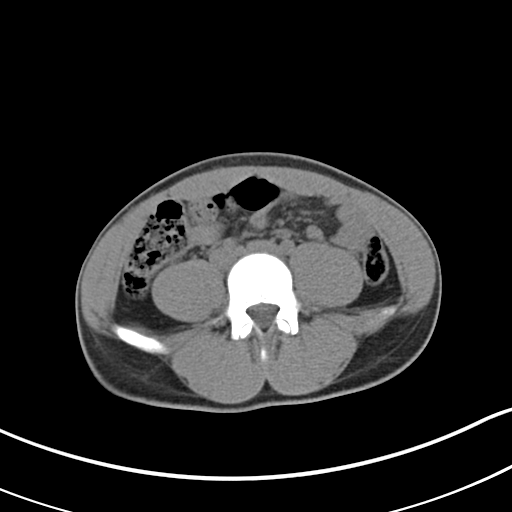
[im 46/92  soft-tissue]
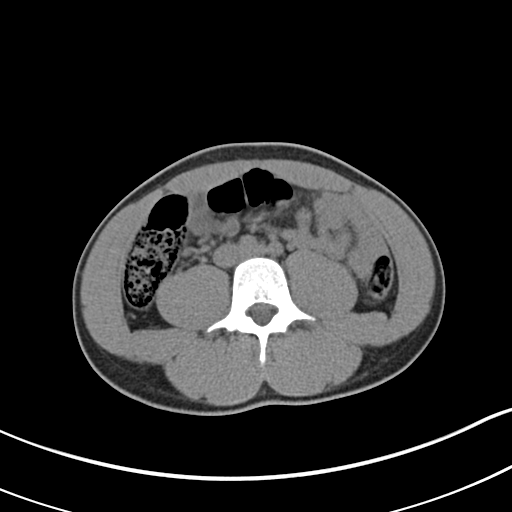
[im 51/92  soft-tissue]
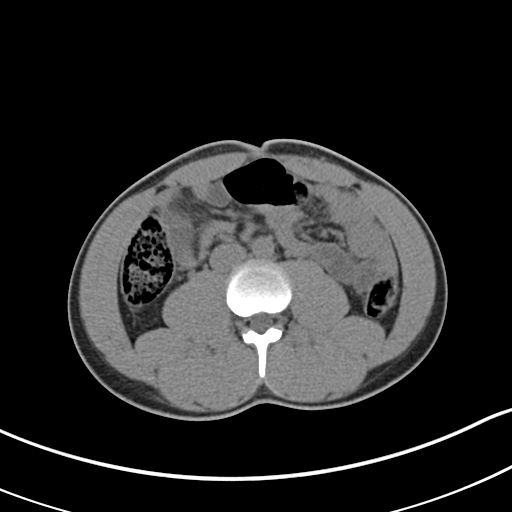
[im 61/92  soft-tissue]
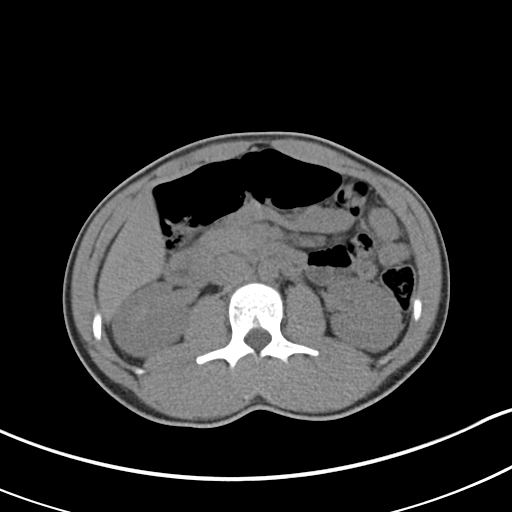
[im 61/92  bone]
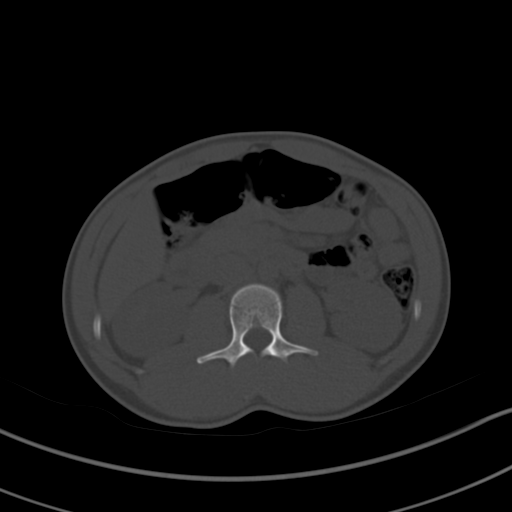
[im 66/92  soft-tissue]
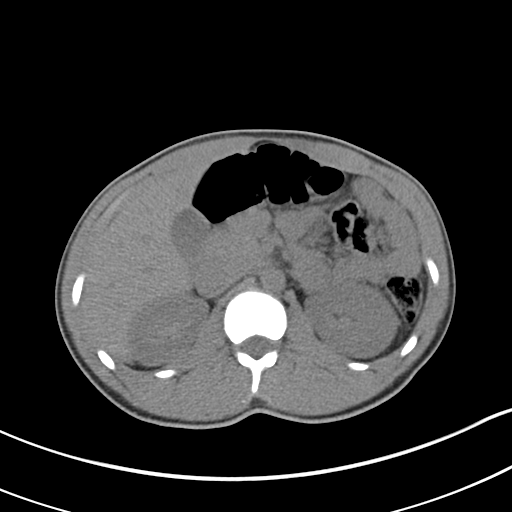
[im 71/92  soft-tissue]
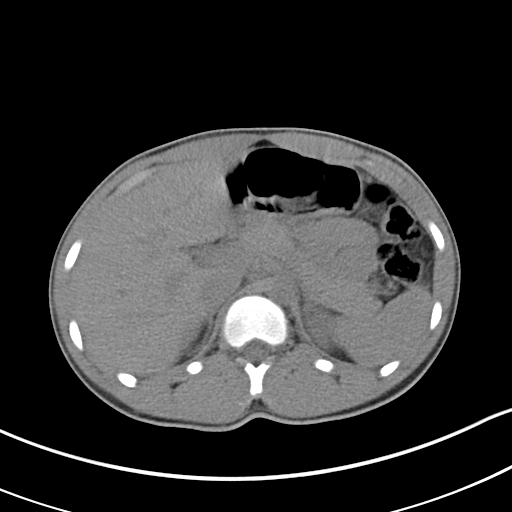
[im 81/92  soft-tissue]
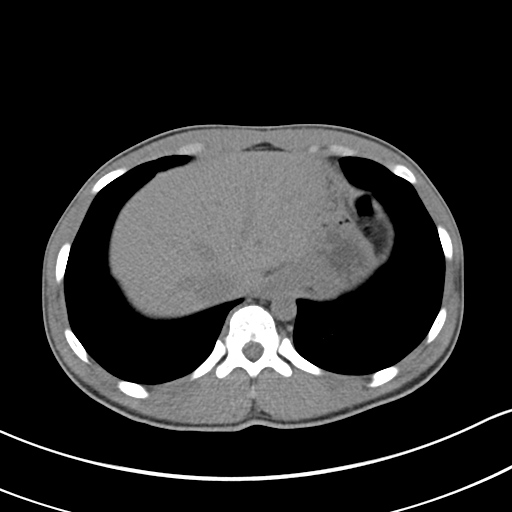
[im 86/92  soft-tissue]
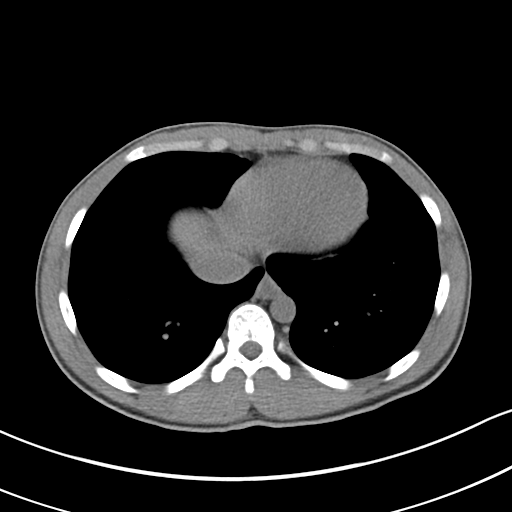

[Series 5: coronal · coronal · 0.63mm/px · 3 of 121 slices shown]
[im 41/121  soft-tissue]
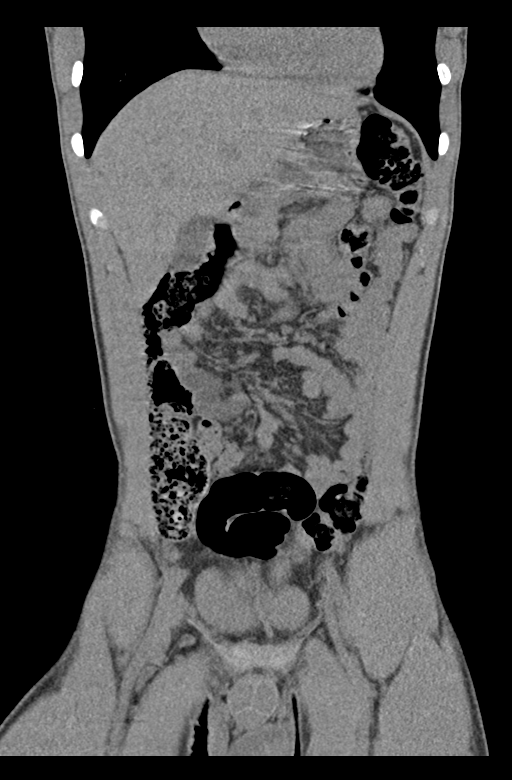
[im 54/121  soft-tissue]
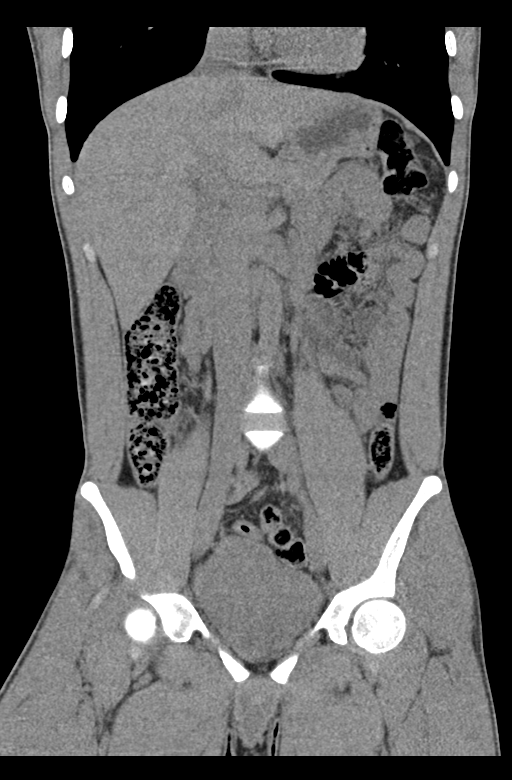
[im 67/121  soft-tissue]
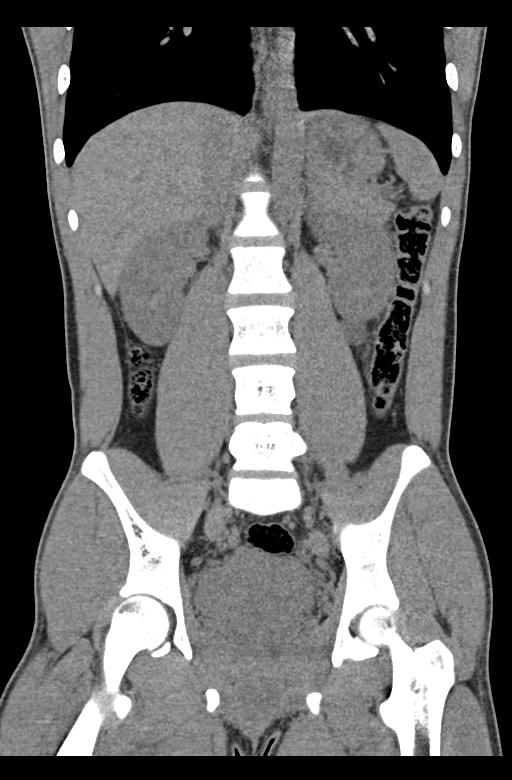

[16 of 46 positions shown; findings below may reference images not displayed]

FINDINGS: Lower chest: Unremarkable

Hepatobiliary: Unremarkable

Pancreas: Unremarkable

Spleen: Unremarkable

Adrenals/Urinary Tract: Unremarkable

Stomach/Bowel: Small appendicolith noted without findings of
appendiceal inflammation.

Vascular/Lymphatic: Unremarkable

Reproductive: Left hydrocele.

Other: No supplemental non-categorized findings.

Musculoskeletal: Unremarkable
IMPRESSION: 1. Left scrotal hydrocele. Please see critical results in the report
for left scrotal ultrasound.

## 2022-09-04 IMAGING — US US SCROTUM W/ DOPPLER COMPLETE
1 series · 14 of 25 positions shown · non-contrast
Comparison: Scrotal ultrasound dated 05/03/2021.

CLINICAL DATA: History of testicular torsion, presenting with
testicular pain for 1 day.

EXAM:
SCROTAL ULTRASOUND
DOPPLER ULTRASOUND OF THE TESTICLES
TECHNIQUE: Complete ultrasound examination of the testicles, epididymis, and
other scrotal structures was performed. Color and spectral Doppler
ultrasound were also utilized to evaluate blood flow to the
testicles.

[Series 1: us scrotum w/ doppler complete · 68 acquisitions, 14 frames shown]
[im 1/68]
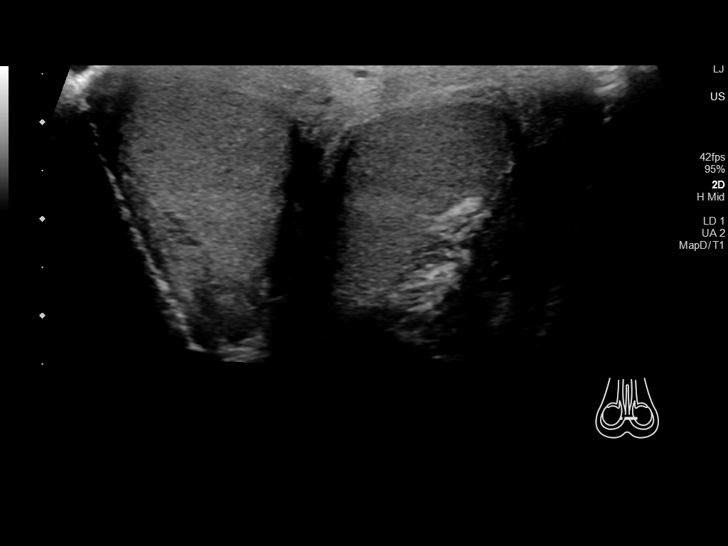
[im 6/68]
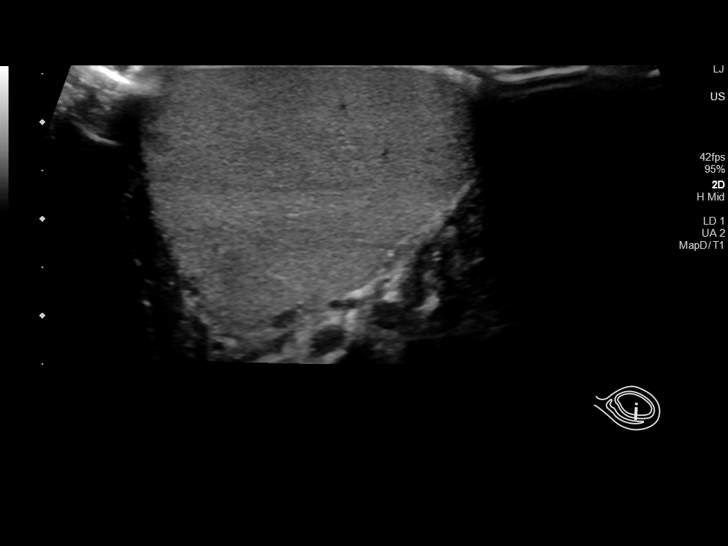
[im 12/68]
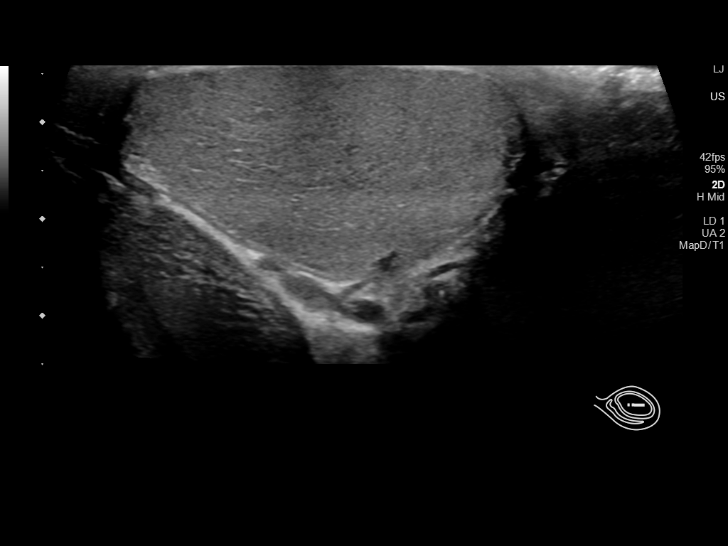
[im 17/68]
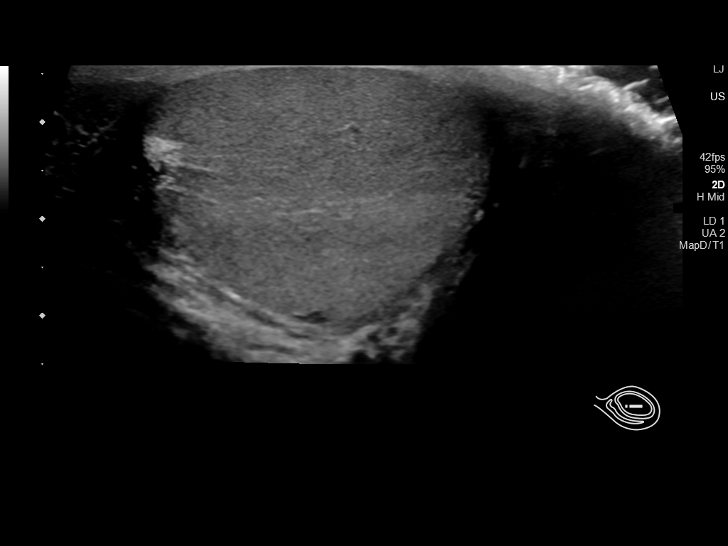
[im 23/68]
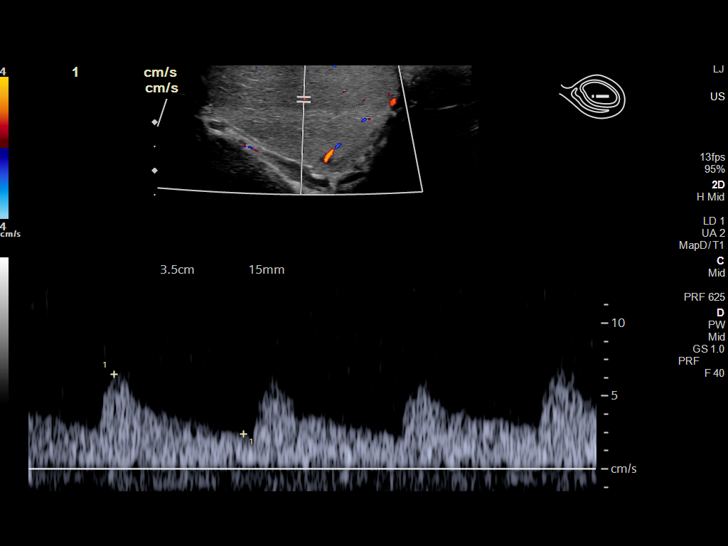
[im 26/68]
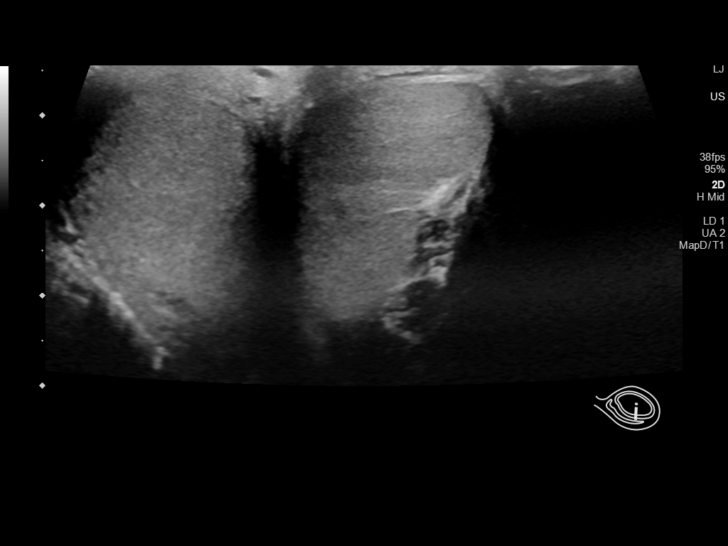
[im 31/68]
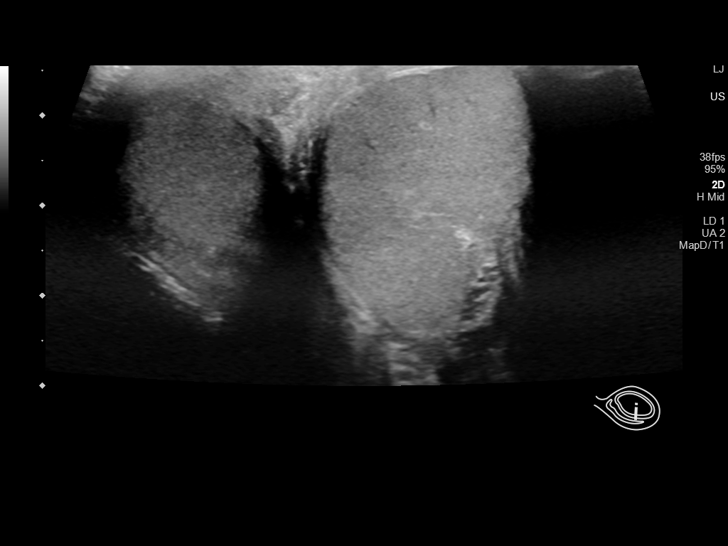
[im 37/68]
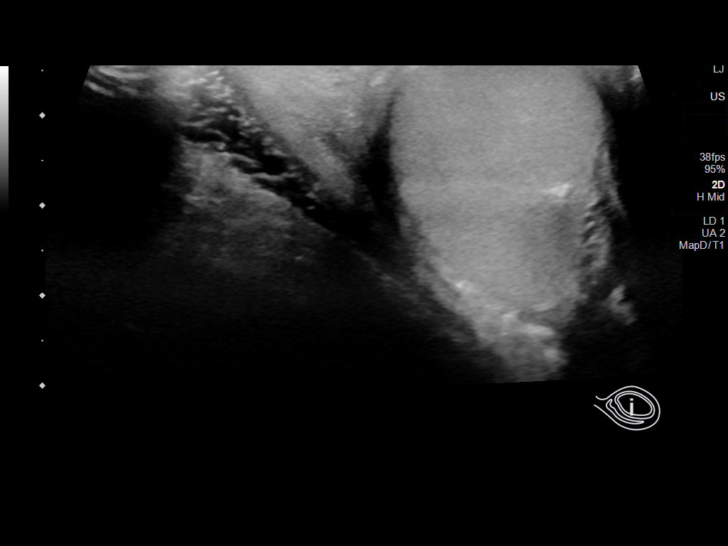
[im 42/68]
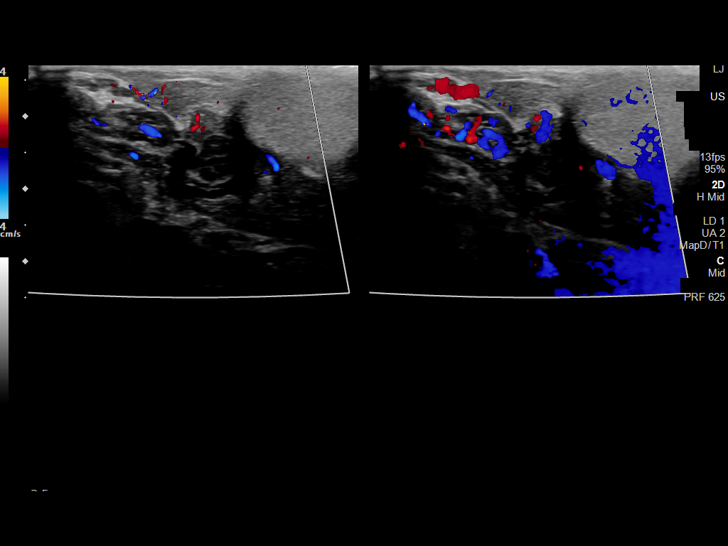
[im 45/68]
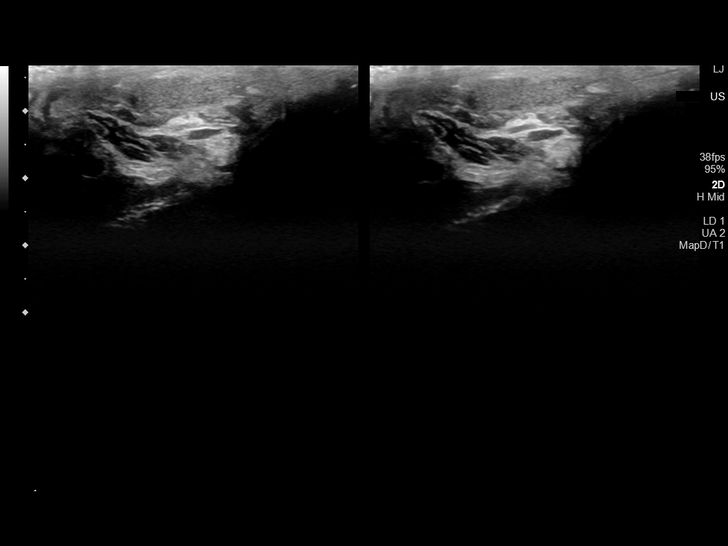
[im 51/68]
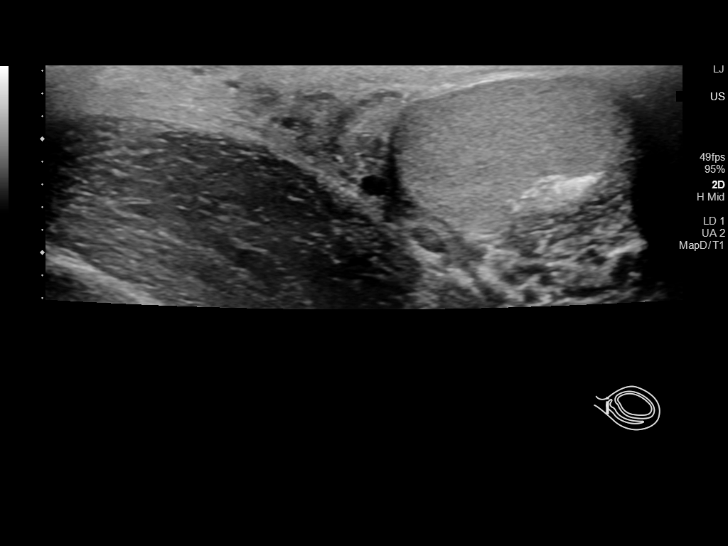
[im 56/68]
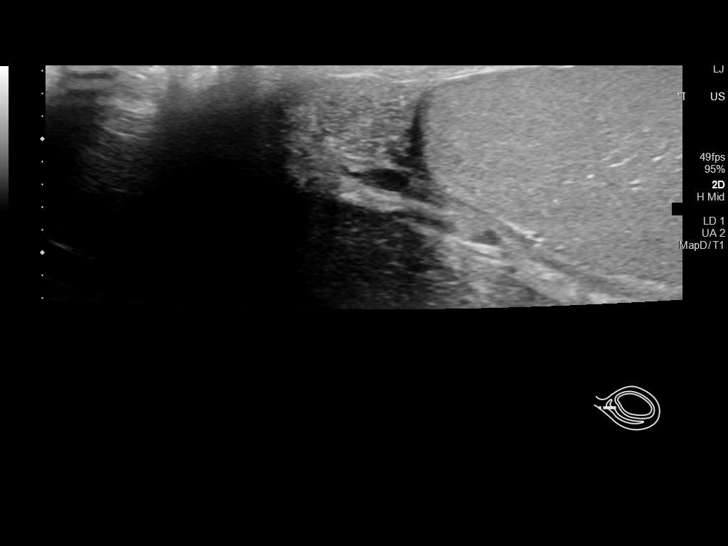
[im 62/68]
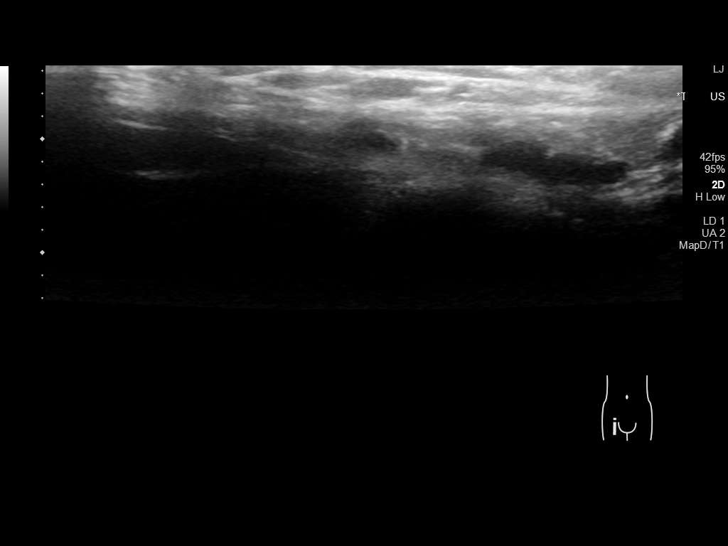
[im 68/68]
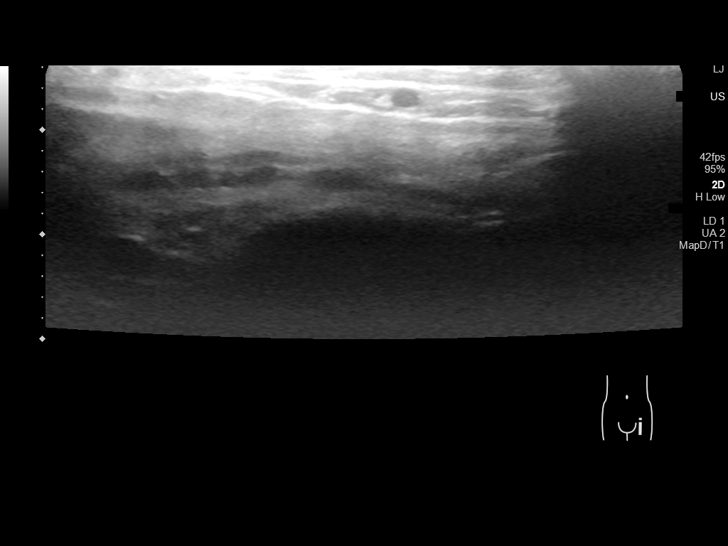

[14 of 25 positions shown; findings below may reference images not displayed]

FINDINGS: Right testicle

Measurements: 4.0 x 2.2 x 3.5 cm. No mass or microlithiasis
visualized.

Left testicle

Measurements: 5.1 x 2.5 x 2.4 cm. No mass or microlithiasis
visualized.

Right epididymis:  Normal in size and appearance.

Left epididymis:  Normal in size and appearance.

Hydrocele:  None visualized.

Varicocele:  None visualized.

Pulsed Doppler interrogation of both testes demonstrates normal low
resistance arterial and venous waveforms bilaterally.
IMPRESSION: Normal exam.

## 2023-12-23 ENCOUNTER — Ambulatory Visit

## 2024-02-13 NOTE — Progress Notes (Signed)
 [address] [facility] [address] - AMBULATORY [facility]  - URGENT CARE [place] [address] [facility] [address] [place] [address]   Date of Service: 02/13/2024 Patient DOB: 2001/09/06    History of Present Illness   Patient ID: Philip Beltran is a 23 y.o. male. Patient presents to urgent care due to right ankle pain after playing basketball yesterday.  Patient states that his ankle inverted after jumping and is having residual pain.  Aggravated with ambulation and twisting. Symptoms relieved with rest.  Has not taken any OTC medication at this time.  Reports associated swelling.  Denies weakness, numbness, tingling, redness.  Past Medical History:  Diagnosis Date  . ADHD (attention deficit hyperactivity disorder)   . Asthma (CMD)     BP 125/87 (BP Location: Left arm, Patient Position: Sitting)   Pulse 72   Wt 59 kg (130 lb)   BMI 22.31 kg/m    Review of Systems   Review of Systems  Musculoskeletal:  Positive for arthralgias (right ankle pain).     Physicial Exam   Physical Exam Constitutional:      Appearance: Normal appearance. He is normal weight.  Musculoskeletal:     Right ankle: No swelling, deformity, ecchymosis or lacerations. No tenderness. Normal range of motion. Anterior drawer test negative. Normal pulse.     Right Achilles Tendon: No tenderness or defects.  Skin:    General: Skin is warm and dry.  Neurological:     General: No focal deficit present.     Mental Status: He is alert and oriented to person, place, and time.     Gait: Gait normal.      Diagnosis   Philip Beltran was seen today for leg pain.  Diagnoses and all orders for this visit:  Sprain of right ankle, unspecified ligament, initial encounter -     Amb DME ankle brace     Medical Decision Making Philip Beltran is a 23 y.o. male who presents to urgent care today with complaints of  Chief Complaint  Patient presents with  . Leg Pain    Patient presents with right leg pain  after bending leg yesterday playing basketball. No bruising to leg, mild swelling. Patient is not able to bear weight on right leg. No numbness or tingling present at time of visit. Patient states moving leg certain ways causes pain. No previous injuries to leg. Patient has not taken anything for pain at this time.     On exam, VS stable and patient is afebrile. Appears well-hydrated and capillary refill <2 seconds.  Ddx: fx, sprain/strain, effusion  [place] Ankle Rule: Pain in malleolar zone Bony tenderness to posterior edge or tip of lateral malleolus Bony tenderness to posterior edge or tip of medial malleolus Inability to bear weight both immediately after and in ED ___________________________________________ Score: 0  Patient presents to urgent care due to right ankle pain. Based on [place] criteria, pt has a score of 0. I do not feel that imaging is needed at this time. Ankle brace placed in clinic. Extensive discussion regarding supportive care and return precautions provided.  Discharge Information  Symptomatic management discussed.  Patient was given verbal and written instructions on symptoms that necessitate return to the UC/ED, and instructed to f/u w/ UC or PCP if not improving in expected timeframe.   Patient/parent has been instructed on RX/OTC medications, dosages, side effects, and possible interactions as associated with each diagnosis in my impression and plan above.   Patient education (  verbal/handout) given on diagnosis, pathophysiology, treatment of diagnosis, side effects of medication use for treatment, restrictions while taking medication, supportives measures such as staying hydrated.   Red Flags associated with diagnosis/es were reviewed and patient instructed on action plan if red flags develop.   They have been instructed that if symptoms worsen or red flags develop they should return to Urgent Care, go to the nearest ED, or activate EMS/911.     Patient and/or  parent/guardian (if applicable) agreed with plan and voiced understanding.  No barriers to adherence perceived by myself.   Portions of this note may have been dictated using Dragon dictation software/hardware and may contain grammatical or spelling errors.   Electronically signed by @ELECTRON[employer]SIG @ at 3:55 PM.  If a new prescription was given today, then I discussed potential side effects, drug interactions, instructions for taking the medication, and the consequences of not taking it.    F/u: Follow up closely with primary care provider (PCP) and other specialists for further care and routine care, but seek medical attention sooner if worsening/concerning signs or symptoms.  Home Care   Electronically signed by: Darryle Slater Fish, PA-C 02/13/2024 3:55 PM

## 2024-07-16 ENCOUNTER — Other Ambulatory Visit: Payer: Self-pay

## 2024-07-16 ENCOUNTER — Emergency Department

## 2024-07-16 ENCOUNTER — Emergency Department
Admission: EM | Admit: 2024-07-16 | Discharge: 2024-07-16 | Disposition: A | Discharge status: Home / Self Care | Attending: Emergency Medicine | Admitting: Emergency Medicine

## 2024-07-16 DIAGNOSIS — F19129 Other psychoactive substance abuse with intoxication, unspecified: Secondary | ICD-10-CM | POA: Insufficient documentation

## 2024-07-16 DIAGNOSIS — F22 Delusional disorders: Secondary | ICD-10-CM | POA: Diagnosis not present

## 2024-07-16 DIAGNOSIS — F19929 Other psychoactive substance use, unspecified with intoxication, unspecified: Secondary | ICD-10-CM

## 2024-07-16 LAB — COMPREHENSIVE METABOLIC PANEL WITH GFR
ALT: 16 U/L (ref 0–44)
AST: 31 U/L (ref 15–41)
Albumin: 4 g/dL (ref 3.5–5.0)
Alkaline Phosphatase: 38 U/L (ref 38–126)
Anion gap: 13 (ref 5–15)
BUN: 12 mg/dL (ref 6–20)
CO2: 22 mmol/L (ref 22–32)
Calcium: 8.8 mg/dL — ABNORMAL LOW (ref 8.9–10.3)
Chloride: 103 mmol/L (ref 98–111)
Creatinine, Ser: 1.23 mg/dL (ref 0.61–1.24)
GFR, Estimated: >60 mL/min (ref >60)
Glucose, Bld: 199 mg/dL — ABNORMAL HIGH (ref 70–99)
Potassium: 4 mmol/L (ref 3.5–5.1)
Sodium: 138 mmol/L (ref 135–145)
Total Bilirubin: 1 mg/dL (ref 0.0–1.2)
Total Protein: 7.3 g/dL (ref 6.5–8.1)

## 2024-07-16 LAB — CBC WITH DIFFERENTIAL/PLATELET
Abs Immature Granulocytes: 0.01 K/uL (ref 0.00–0.07)
Basophils Absolute: 0 K/uL (ref 0.0–0.1)
Basophils Relative: 1 %
Eosinophils Absolute: 0 K/uL (ref 0.0–0.5)
Eosinophils Relative: 0 %
HCT: 45.2 % (ref 39.0–52.0)
Hemoglobin: 14.8 g/dL (ref 13.0–17.0)
Immature Granulocytes: 0 %
Lymphocytes Relative: 25 %
Lymphs Abs: 1.7 K/uL (ref 0.7–4.0)
MCH: 29.7 pg (ref 26.0–34.0)
MCHC: 32.7 g/dL (ref 30.0–36.0)
MCV: 90.8 fL (ref 80.0–100.0)
Monocytes Absolute: 0.5 K/uL (ref 0.1–1.0)
Monocytes Relative: 7 %
Neutro Abs: 4.5 K/uL (ref 1.7–7.7)
Neutrophils Relative %: 67 %
Platelets: 210 K/uL (ref 150–400)
RBC: 4.98 MIL/uL (ref 4.22–5.81)
RDW: 12.3 % (ref 11.5–15.5)
WBC: 6.8 K/uL (ref 4.0–10.5)
nRBC: 0 % (ref 0.0–0.2)

## 2024-07-16 LAB — ETHANOL: Alcohol, Ethyl (B): <15 mg/dL (ref <15)

## 2024-07-16 MED ORDER — ONDANSETRON 4 MG PO TBDP
4.0000 mg | ORAL_TABLET | Freq: Three times a day (TID) | ORAL | 0 refills | Status: AC | PRN
  Filled 2024-07-16: qty 30, 10d supply, fill #0

## 2024-07-16 MED ORDER — ONDANSETRON HCL 4 MG/2ML IJ SOLN
4.0000 mg | Freq: Once | INTRAMUSCULAR | Status: AC
  Administered 2024-07-16: 4 mg via INTRAVENOUS
  Filled 2024-07-16: qty 2

## 2024-07-16 MED ORDER — LORAZEPAM 2 MG/ML IJ SOLN
2.0000 mg | Freq: Once | INTRAMUSCULAR | Status: AC
  Administered 2024-07-16: 2 mg via INTRAVENOUS
  Filled 2024-07-16: qty 1

## 2024-07-16 NOTE — ED Provider Notes (Addendum)
 Care assumed of patient from outgoing provider.  See their note for initial history, exam and plan.  Clinical Course as of 07/16/24 0908  Tue Jul 16, 2024  0703 Intentional overdose without intent of self harm.  Presented paranoid.  Labs reassuring. Given ativan .  Re-eval for clinical sobriety.  [SM]  [id number] On reevaluation patient states that he is feeling better.  Sitting up and eating some cereal.  States that he would call a ride to go home. [SM]    Clinical Course User Index [SM] Suzanne Kirsch, MD   On reevaluation patient has significant improvement.  Does endorse taking a large amount of marijuana Gummies.  Patient's mother is at bedside.  Has a nonfocal neurologic exam.  On my evaluation CT scan of head with no signs of intracranial hemorrhage or infarction.  Will give a prescription for antiemetics.  Given information to follow-up as an outpatient with primary care given outpatient resources for rehab and help with addiction issues.  Discussed return precautions for any ongoing or worsening symptoms.  Discharged home with his mother.   Suzanne Kirsch, MD 07/16/24 [id number]    Suzanne Kirsch, MD 07/16/24 1153

## 2024-07-16 NOTE — ED Triage Notes (Signed)
 Chief Complaint  Patient presents with   Paranoid   Drug Problem   Pt arrives to ED reporting that he has used psychedelics such as LSD and mushrooms within the last few days. Pt reports that he also used an marijuana edible yesterday and has taken his prescribed Adderall. Pt reports that he has been hearing voices they talk about the [employer]. Denies SI/HI. Denies visual hallucinations. Pt reports that he has been paranoid within the last 2 days. Reports he has been reading his bible and sleeping less. Per EMS pt became very paranoid during transportation to the ED.  Past Medical History:  Diagnosis Date   ADD (attention deficit disorder)    Asthma

## 2024-07-16 NOTE — ED Provider Notes (Signed)
 [facility] Provider Note    None    (approximate)   History   Paranoid and Drug Problem   HPI  Philip Beltran is a 23 y.o. male   Past medical history of no significant past medical history presents to the Emergency Department with paranoia and feeling like I might die after taking drugs yesterday.  He notes that he took psychedelics and told our nurse that he took a combination of his prescribed Adderall, some sort of edible cannabis product, LSD yesterday.  He was feeling well before.  His intention was to get high.  He started feeling paranoid like somebody is watching me and this impending sense of doom.  He denies any acute pain.  He states a vague sense of dizziness.  He denies any alcohol use.  He denies suicidality, homicidality, or audiovisual hallucinations.  He states that his intention of using the drug does not self-harm but instead to get high.  External Medical Documents Reviewed: Prior outpatient notes      Physical Exam   Triage Vital Signs: ED Triage Vitals  Encounter Vitals Group     BP      Girls Systolic BP Percentile      Girls Diastolic BP Percentile      Boys Systolic BP Percentile      Boys Diastolic BP Percentile      Pulse      Resp      Temp      Temp src      SpO2      Weight      Height      Head Circumference      Peak Flow      Pain Score      Pain Loc      Pain Education      Exclude from Growth Chart     Most recent vital signs: Vitals:   07/16/24 0519 07/16/24 0523  BP:  129/67  Pulse:  75  Resp:  18  Temp:  98.1 F (36.7 C)  SpO2: 99%     General: Awake, no distress.  CV:  Good peripheral perfusion.  Resp:  Normal effort.  Abd:  No distention.  Other:  Awake alert maintaining airway breathing comfortably on room air answering my questions appropriately.  No signs of head trauma or trauma to the torso or arms.  Soft and on abdominal exam.  No sustained clonus to the ankle.     ED  Results / Procedures / Treatments   Labs (all labs ordered are listed, but only abnormal results are displayed) Labs Reviewed  COMPREHENSIVE METABOLIC PANEL WITH GFR  ETHANOL  CBC WITH DIFFERENTIAL/PLATELET  URINE DRUG SCREEN, QUALITATIVE ([facility] ONLY)     I ordered and reviewed the above labs they are notable for cell counts unremarkable  EKG  ED ECG REPORT I, Ginnie Shams, the attending physician, personally viewed and interpreted this ECG.   Date: 07/16/2024  EKG Time: 0527  Rate: 74  Rhythm: sinus  Axis: nl  Intervals:nl  ST&T Change: no stemi    PROCEDURES:  Critical Care performed: No  Procedures   MEDICATIONS ORDERED IN ED: Medications  LORazepam  (ATIVAN ) injection 2 mg (2 mg Intravenous Given 07/16/24 0532)     IMPRESSION / MDM / ASSESSMENT AND PLAN / ED COURSE  I reviewed the triage vital signs and the nursing notes.  Patient's presentation is most consistent with acute presentation with potential threat to life or bodily function.  Differential diagnosis includes, but is not limited to, adverse effect of recreational drugs, considered but less likely self-harm or suicide attempt, dysrhythmia, infection.    MDM:    I think his paranoia and impending sense of doom is a bad trip from his psychedelic and multiple drug use.  He is comfortable staying in the emergency department till the effects wear off, he is feeling better just being under our care.  He denies any self-harm attempts.  Will check EKG, basic labs, keep on cardiopulmonary monitoring, and give 2 mg of IV Ativan  to help with his anxiety and help him rest better.       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    Cyrena Mylar, MD 07/16/24 [phone]

## 2024-07-16 NOTE — Discharge Instructions (Addendum)
 I think your symptoms tonight were due to the drugs that you used.  I think it is best that you stop using these drugs as this can cause bad effects that can make you feel quite unwell.  Thank you for choosing us  for your health care today!  Please see your primary doctor this week for a follow up appointment.   If you have any new, worsening, or unexpected symptoms call your doctor right away or come back to the emergency department for reevaluation.  It was my pleasure to care for you today.   Ginnie EDISON Cyrena, MD

## 2024-07-16 NOTE — ED Notes (Signed)
 EKG given to Dr. Cyrena

## 2024-08-02 ENCOUNTER — Ambulatory Visit
Admission: EM | Admit: 2024-08-02 | Discharge: 2024-08-02 | Disposition: A | Discharge status: Home / Self Care | Attending: Emergency Medicine | Admitting: Emergency Medicine

## 2024-08-02 DIAGNOSIS — L02412 Cutaneous abscess of left axilla: Secondary | ICD-10-CM

## 2024-08-02 MED ORDER — DOXYCYCLINE HYCLATE 100 MG PO CAPS: 100.0000 mg | ORAL_CAPSULE | Freq: Two times a day (BID) | ORAL | 0 refills | Status: AC

## 2024-08-02 NOTE — Discharge Instructions (Signed)
 Abscess has been open here in the clinic to attempt to drain  Take doxycycline twice daily for 7 days  Hold warm-hot compresses to affected area at least 4 times a day, this helps to facilitate draining, the more the better  Please return for evaluation for increased swelling, increased tenderness or pain, non healing site, non draining site, you begin to have fever or chills   We reviewed the etiology of recurrent abscesses of skin.  Skin abscesses are collections of pus within the dermis and deeper skin tissues. Skin abscesses manifest as painful, tender, fluctuant, and erythematous nodules, frequently surmounted by a pustule and surrounded by a rim of erythematous swelling.  Spontaneous drainage of purulent material may occur.  Fever can occur on occasion.    -Skin abscesses can develop in healthy individuals with no predisposing conditions other than skin or nasal carriage of Staphylococcus aureus.  Individuals in close contact with others who have active infection with skin abscesses are at increased risk which is likely to explain why twin brother has similar episodes.   In addition, any process leading to a breach in the skin barrier can also predispose to the development of a skin abscesses, such as atopic dermatitis.

## 2024-08-02 NOTE — ED Notes (Signed)
 Patient triage by  Teresa Shelba SAUNDERS, NP

## 2024-08-02 NOTE — ED Provider Notes (Signed)
 Philip Beltran    CSN: [id number] Arrival date & time: 08/02/24  1741      History   Chief Complaint No chief complaint on file.   HPI Philip Beltran is a 23 y.o. male.   Patient presents for evaluation of a bump under the left axilla beginning 4 to 5 days ago, has increased in size and become painful over the last 2 days, can be felt with all movement.  Has not attempted treatment.  Endorses that he changed to a organic deodorant 2 weeks ago, unsure if related.  Denies fever or drainage.  Past Medical History:  Diagnosis Date   ADD (attention deficit disorder)    Asthma     There are no active problems to display for this patient.   Past Surgical History:  Procedure Laterality Date   TESTICLE TORSION REDUCTION N/A 05/03/2021   Procedure: SCROTAL EXPLORATION, BILATERAL ORCHIOPEXY;  Surgeon: Elisabeth Valli BIRCH, MD;  Location: [facility];  Service: Urology;  Laterality: N/A;       Home Medications    Prior to Admission medications   Medication Sig Start Date End Date Taking? Authorizing Provider  acetaminophen  (TYLENOL ) 160 MG/5ML liquid Take 320 mg by mouth every 4 (four) hours as needed for fever or pain.    [provider]  albuterol (VENTOLIN HFA) 108 (90 Base) MCG/ACT inhaler Inhale 2 puffs into the lungs every 6 (six) hours as needed for wheezing or shortness of breath. Patient not taking: Reported on 07/16/2024 07/25/17   [provider]  amphetamine-dextroamphetamine (ADDERALL XR) 15 MG 24 hr capsule Take 15 mg by mouth every morning.    [provider]  doxycycline (VIBRAMYCIN) 100 MG capsule Take 1 capsule (100 mg total) by mouth 2 (two) times daily. 08/02/24  Yes Harry Shuck R, NP  ibuprofen  (ADVIL ) 200 MG tablet Take 200 mg by mouth every 6 (six) hours as needed for mild pain.    [provider]  loratadine (CLARITIN) 10 MG tablet Take 10 mg by mouth daily. Patient not taking: Reported on 07/16/2024 09/02/19   [provider]  ondansetron  (ZOFRAN -ODT) 4 MG disintegrating tablet Take 1 tablet (4 mg total) by mouth every 8 (eight) hours as needed for nausea or vomiting. 07/16/24   Suzanne Kirsch, MD    Family History No family history on file.  Social History Social History   Tobacco Use   Smoking status: Never   Smokeless tobacco: Never     Allergies   Patient has no known allergies.   Review of Systems Review of Systems   Physical Exam Triage Vital Signs ED Triage Vitals  Encounter Vitals Group     BP      Girls Systolic BP Percentile      Girls Diastolic BP Percentile      Boys Systolic BP Percentile      Boys Diastolic BP Percentile      Pulse      Resp      Temp      Temp src      SpO2      Weight      Height      Head Circumference      Peak Flow      Pain Score      Pain Loc      Pain Education      Exclude from Growth Chart    No data found.  Updated Vital Signs There were no vitals taken  for this visit.  Visual Acuity Right Eye Distance:   Left Eye Distance:   Bilateral Distance:    Right Eye Near:   Left Eye Near:    Bilateral Near:     Physical Exam Constitutional:      Appearance: Normal appearance.  HENT:     Head: Normocephalic.  Eyes:     Extraocular Movements: Extraocular movements intact.  Pulmonary:     Effort: Pulmonary effort is normal.  Skin:    Comments: 2 x 3 erythematous abscess present underneath the left axilla  Neurological:     Mental Status: He is alert and oriented to person, place, and time.      UC Treatments / Results  Labs (all labs ordered are listed, but only abnormal results are displayed) Labs Reviewed - No data to display  EKG   Radiology No results found.  Procedures Incision and Drainage  Date/Time: 08/02/2024 6:21 PM  Performed by: Teresa Shelba SAUNDERS, NP Authorized by: Teresa Shelba SAUNDERS, NP   Consent:    Consent obtained:  Verbal   Consent given by:  Patient   Risks, benefits, and  alternatives were discussed: yes     Risks discussed:  Incomplete drainage   Alternatives discussed:  Delayed treatment Universal protocol:    Procedure explained and questions answered to patient or proxy's satisfaction: yes     Patient identity confirmed:  Verbally with patient Location:    Type:  Abscess   Size:  2x3   Location: left axilla. Pre-procedure details:    Skin preparation:  Chlorhexidine with alcohol Anesthesia:    Anesthesia method:  Local infiltration   Local anesthetic:  Lidocaine 1% w/o epi Procedure type:    Complexity:  Simple Procedure details:    Ultrasound guidance: no     Needle aspiration: no     Incision types:  Single straight   Drainage:  Purulent and bloody   Drainage amount:  Scant   Wound treatment:  Wound left open   Packing materials:  None Post-procedure details:    Procedure completion:  Tolerated  (including critical care time)  Medications Ordered in UC Medications - No data to display  Initial Impression / Assessment and Plan / UC Course  I have reviewed the triage vital signs and the nursing notes.  Pertinent labs & imaging results that were available during my care of the patient were reviewed by me and considered in my medical decision making (see chart for details).  Abscess of left axilla  I&D completed, yielding minimal result, this case with patient possibility of this occurring prior to procedure as abscess was slightly firm, prescribed doxycycline recommended warm compresses to the affected area, may use over-the-counter analgesics as needed for pain, advised to follow-up for any concerns regarding healing Final Clinical Impressions(s) / UC Diagnoses   Final diagnoses:  Abscess of left axilla     Discharge Instructions      Abscess has been open here in the clinic to attempt to drain  Take doxycycline twice daily for 7 days  Hold warm-hot compresses to affected area at least 4 times a day, this helps to facilitate  draining, the more the better  Please return for evaluation for increased swelling, increased tenderness or pain, non healing site, non draining site, you begin to have fever or chills   We reviewed the etiology of recurrent abscesses of skin.  Skin abscesses are collections of pus within the dermis and deeper skin tissues. Skin abscesses manifest as  painful, tender, fluctuant, and erythematous nodules, frequently surmounted by a pustule and surrounded by a rim of erythematous swelling.  Spontaneous drainage of purulent material may occur.  Fever can occur on occasion.    -Skin abscesses can develop in healthy individuals with no predisposing conditions other than skin or nasal carriage of Staphylococcus aureus.  Individuals in close contact with others who have active infection with skin abscesses are at increased risk which is likely to explain why twin brother has similar episodes.   In addition, any process leading to a breach in the skin barrier can also predispose to the development of a skin abscesses, such as atopic dermatitis.      ED Prescriptions     Medication Sig Dispense Auth. Provider   doxycycline (VIBRAMYCIN) 100 MG capsule Take 1 capsule (100 mg total) by mouth 2 (two) times daily. 14 capsule Ulyana Pitones, Shelba SAUNDERS, NP      PDMP not reviewed this encounter.   Teresa Shelba SAUNDERS, NP 08/02/24 [phone]

## 2024-09-19 ENCOUNTER — Encounter (HOSPITAL_BASED_OUTPATIENT_CLINIC_OR_DEPARTMENT_OTHER): Payer: Self-pay | Admitting: Emergency Medicine

## 2024-09-19 ENCOUNTER — Emergency Department (HOSPITAL_BASED_OUTPATIENT_CLINIC_OR_DEPARTMENT_OTHER)
Admission: EM | Admit: 2024-09-19 | Discharge: 2024-09-19 | Disposition: A | Discharge status: Home / Self Care | Attending: Emergency Medicine | Admitting: Emergency Medicine

## 2024-09-19 ENCOUNTER — Other Ambulatory Visit: Payer: Self-pay

## 2024-09-19 DIAGNOSIS — E860 Dehydration: Secondary | ICD-10-CM | POA: Diagnosis present

## 2024-09-19 DIAGNOSIS — Z79899 Other long term (current) drug therapy: Secondary | ICD-10-CM | POA: Diagnosis not present

## 2024-09-19 DIAGNOSIS — Z7951 Long term (current) use of inhaled steroids: Secondary | ICD-10-CM | POA: Insufficient documentation

## 2024-09-19 DIAGNOSIS — J45909 Unspecified asthma, uncomplicated: Secondary | ICD-10-CM | POA: Insufficient documentation

## 2024-09-19 LAB — CBC WITH DIFFERENTIAL/PLATELET
Abs Immature Granulocytes: 0.01 K/uL (ref 0.00–0.07)
Basophils Absolute: 0.1 K/uL (ref 0.0–0.1)
Basophils Relative: 1 %
Eosinophils Absolute: 0.1 K/uL (ref 0.0–0.5)
Eosinophils Relative: 1 %
HCT: 42 % (ref 39.0–52.0)
Hemoglobin: 14.3 g/dL (ref 13.0–17.0)
Immature Granulocytes: 0 %
Lymphocytes Relative: 23 %
Lymphs Abs: 1.7 K/uL (ref 0.7–4.0)
MCH: 30.2 pg (ref 26.0–34.0)
MCHC: 34 g/dL (ref 30.0–36.0)
MCV: 88.8 fL (ref 80.0–100.0)
Monocytes Absolute: 0.6 K/uL (ref 0.1–1.0)
Monocytes Relative: 8 %
Neutro Abs: 4.9 K/uL (ref 1.7–7.7)
Neutrophils Relative %: 67 %
Platelets: 203 K/uL (ref 150–400)
RBC: 4.73 MIL/uL (ref 4.22–5.81)
RDW: 12.3 % (ref 11.5–15.5)
WBC: 7.3 K/uL (ref 4.0–10.5)
nRBC: 0 % (ref 0.0–0.2)

## 2024-09-19 LAB — COMPREHENSIVE METABOLIC PANEL WITH GFR
ALT: 9 U/L (ref 0–44)
AST: 16 U/L (ref 15–41)
Albumin: 4.5 g/dL (ref 3.5–5.0)
Alkaline Phosphatase: 57 U/L (ref 38–126)
Anion gap: 11 (ref 5–15)
BUN: 15 mg/dL (ref 6–20)
CO2: 24 mmol/L (ref 22–32)
Calcium: 9.6 mg/dL (ref 8.9–10.3)
Chloride: 103 mmol/L (ref 98–111)
Creatinine, Ser: 1.18 mg/dL (ref 0.61–1.24)
GFR, Estimated: >60 mL/min (ref >60)
Glucose, Bld: 133 mg/dL — ABNORMAL HIGH (ref 70–99)
Potassium: 3.8 mmol/L (ref 3.5–5.1)
Sodium: 139 mmol/L (ref 135–145)
Total Bilirubin: <0.2 mg/dL (ref 0.0–1.2)
Total Protein: 7.3 g/dL (ref 6.5–8.1)

## 2024-09-19 LAB — URINALYSIS, ROUTINE W REFLEX MICROSCOPIC
Bilirubin Urine: NEGATIVE
Glucose, UA: 100 mg/dL — AB
Hgb urine dipstick: NEGATIVE
Leukocytes,Ua: NEGATIVE
Nitrite: NEGATIVE
Specific Gravity, Urine: 1.027 (ref 1.005–1.030)
pH: 7 (ref 5.0–8.0)

## 2024-09-19 MED ORDER — SODIUM CHLORIDE 0.9 % IV BOLUS
1000.0000 mL | Freq: Once | INTRAVENOUS | Status: AC
  Administered 2024-09-19: 1000 mL via INTRAVENOUS

## 2024-09-19 NOTE — ED Notes (Signed)
 Reviewed AVS/discharge instruction with patient. Time allotted for and all questions answered. Patient is agreeable for d/c and escorted to ed exit by staff.

## 2024-09-19 NOTE — ED Triage Notes (Signed)
 Urine is bubbly Insomnia Eating a lot of tuna and is concerned about mercury Reports he noticed yellowing of eyes  I just want to get checked out  Recently stopped using marijuana  anxiety

## 2024-09-20 NOTE — ED Provider Notes (Signed)
 [place] EMERGENCY DEPARTMENT AT [facility] Provider Note   CSN: [id number] Arrival date & time: 09/19/24  1958     Patient presents with: No chief complaint on file.   Philip Beltran is a 23 y.o. male.   Pt is a 23 yo male with pmhx significant for ADD and asthma.  He has recently stopped using MJ.  He eats a can of tuna every day and was worried about mercury poisoning.  He also feels his urine is a little cloudy and thinks his eyes are yellow.  He just wants to get checked.         Prior to Admission medications   Medication Sig Start Date End Date Taking? Authorizing Provider  acetaminophen  (TYLENOL ) 160 MG/5ML liquid Take 320 mg by mouth every 4 (four) hours as needed for fever or pain.    [provider]  albuterol (VENTOLIN HFA) 108 (90 Base) MCG/ACT inhaler Inhale 2 puffs into the lungs every 6 (six) hours as needed for wheezing or shortness of breath. Patient not taking: Reported on 07/16/2024 07/25/17   [provider]  amphetamine-dextroamphetamine (ADDERALL XR) 15 MG 24 hr capsule Take 15 mg by mouth every morning.    [provider]  doxycycline  (VIBRAMYCIN ) 100 MG capsule Take 1 capsule (100 mg total) by mouth 2 (two) times daily. 08/02/24   White, Shelba SAUNDERS, NP  ibuprofen  (ADVIL ) 200 MG tablet Take 200 mg by mouth every 6 (six) hours as needed for mild pain.    [provider]  loratadine (CLARITIN) 10 MG tablet Take 10 mg by mouth daily. Patient not taking: Reported on 07/16/2024 09/02/19   [provider]  ondansetron  (ZOFRAN -ODT) 4 MG disintegrating tablet Take 1 tablet (4 mg total) by mouth every 8 (eight) hours as needed for nausea or vomiting. 07/16/24   Suzanne Kirsch, MD    Allergies: Patient has no known allergies.    Review of Systems  All other systems reviewed and are negative.   Updated Vital Signs BP 136/80 (BP Location: Right Arm)   Pulse 99   Temp 98.2 F (36.8 C) (Oral)   Resp 16   SpO2  100%   Physical Exam Vitals and nursing note reviewed.  Constitutional:      Appearance: Normal appearance.  HENT:     Head: Normocephalic and atraumatic.     Right Ear: External ear normal.     Left Ear: External ear normal.     Nose: Nose normal.     Mouth/Throat:     Mouth: Mucous membranes are moist.     Pharynx: Oropharynx is clear.  Eyes:     Extraocular Movements: Extraocular movements intact.     Conjunctiva/sclera: Conjunctivae normal.     Pupils: Pupils are equal, round, and reactive to light.  Cardiovascular:     Rate and Rhythm: Normal rate and regular rhythm.     Pulses: Normal pulses.     Heart sounds: Normal heart sounds.  Pulmonary:     Effort: Pulmonary effort is normal.     Breath sounds: Normal breath sounds.  Abdominal:     General: Abdomen is flat. Bowel sounds are normal.     Palpations: Abdomen is soft.  Musculoskeletal:        General: Normal range of motion.     Cervical back: Normal range of motion and neck supple.  Skin:    General: Skin is warm.     Capillary Refill: Capillary refill takes less than  2 seconds.  Neurological:     General: No focal deficit present.     Mental Status: He is alert and oriented to person, place, and time.  Psychiatric:        Mood and Affect: Mood normal.        Behavior: Behavior normal.     (all labs ordered are listed, but only abnormal results are displayed) Labs Reviewed  URINALYSIS, ROUTINE W REFLEX MICROSCOPIC - Abnormal; Notable for the following components:      Result Value   Glucose, UA 100 (*)    Ketones, ur TRACE (*)    Protein, ur TRACE (*)    All other components within normal limits  COMPREHENSIVE METABOLIC PANEL WITH GFR - Abnormal; Notable for the following components:   Glucose, Bld 133 (*)    All other components within normal limits  CBC WITH DIFFERENTIAL/PLATELET    EKG: None  Radiology: No results found.   Procedures   Medications Ordered in the ED  sodium chloride  0.9 %  bolus 1,000 mL (0 mLs Intravenous Stopped 09/19/24 2141)                                    Medical Decision Making Amount and/or Complexity of Data Reviewed Labs: ordered.   This patient presents to the ED for concern of urine and liver problems, this involves an extensive number of treatment options, and is a complaint that carries with it a high risk of complications and morbidity.  The differential diagnosis includes uti, electrolyte abn, liver issues, infection   Co morbidities that complicate the patient evaluation  Add, asthma   Additional history obtained:  Additional history obtained from epic chart review  Lab Tests:  I Ordered, and personally interpreted labs.  The pertinent results include:  cbc nl, cmp nl; ua + glucose, tr ketones, tr protein  Medicines ordered and prescription drug management:  I ordered medication including ivfs  for sx  Reevaluation of the patient after these medicines showed that the patient improved I have reviewed the patients home medicines and have made adjustments as needed   Problem List / ED Course:  Dehydration:  improved after fluids.  Cbc and cmp nl.  He is stable for d/c.  His amount of tuna intake is likely not going to cause mercury toxicity.  Pt is stable for d/c.  Return if worse.    Reevaluation:  After the interventions noted above, I reevaluated the patient and found that they have :improved   Social Determinants of Health:  Lives at home   Dispostion:  After consideration of the diagnostic results and the patients response to treatment, I feel that the patent would benefit from discharge with outpatient f/u.       Final diagnoses:  Dehydration    ED Discharge Orders     None          Dean Clarity, MD 09/20/24 1840
